# Patient Record
Sex: Male | Born: 1975
Health system: Southern US, Community
[De-identification: ages and names within clinical notes are randomized; demographics above are authoritative.]

## PROBLEM LIST (undated history)

## (undated) ENCOUNTER — Ambulatory Visit (HOSPITAL_COMMUNITY): Admission: EM | Payer: Medicaid Other | Source: Home / Self Care

## (undated) DIAGNOSIS — G8929 Other chronic pain: Secondary | ICD-10-CM

## (undated) DIAGNOSIS — M549 Dorsalgia, unspecified: Secondary | ICD-10-CM

## (undated) DIAGNOSIS — E119 Type 2 diabetes mellitus without complications: Secondary | ICD-10-CM

---

## 2012-12-31 ENCOUNTER — Encounter (HOSPITAL_COMMUNITY): Payer: Self-pay | Admitting: Emergency Medicine

## 2012-12-31 ENCOUNTER — Emergency Department (HOSPITAL_COMMUNITY): Payer: Self-pay

## 2012-12-31 ENCOUNTER — Emergency Department (HOSPITAL_COMMUNITY)
Admission: EM | Admit: 2012-12-31 | Discharge: 2012-12-31 | Disposition: A | Discharge status: Home / Self Care | Payer: Self-pay | Attending: Emergency Medicine | Admitting: Emergency Medicine

## 2012-12-31 DIAGNOSIS — M546 Pain in thoracic spine: Secondary | ICD-10-CM | POA: Insufficient documentation

## 2012-12-31 DIAGNOSIS — F17200 Nicotine dependence, unspecified, uncomplicated: Secondary | ICD-10-CM | POA: Insufficient documentation

## 2012-12-31 DIAGNOSIS — M62838 Other muscle spasm: Secondary | ICD-10-CM | POA: Insufficient documentation

## 2012-12-31 MED ORDER — HYDROCODONE-ACETAMINOPHEN 5-325 MG PO TABS: 1.0000 | ORAL_TABLET | ORAL | Status: DC | PRN

## 2012-12-31 MED ORDER — ONDANSETRON 4 MG PO TBDP
4.0000 mg | ORAL_TABLET | Freq: Once | ORAL | Status: AC
  Administered 2012-12-31: 4 mg via ORAL
  Filled 2012-12-31: qty 1

## 2012-12-31 MED ORDER — CYCLOBENZAPRINE HCL 10 MG PO TABS: 10.0000 mg | ORAL_TABLET | Freq: Two times a day (BID) | ORAL | Status: DC | PRN

## 2012-12-31 MED ORDER — DIAZEPAM 5 MG PO TABS
5.0000 mg | ORAL_TABLET | Freq: Once | ORAL | Status: AC
  Administered 2012-12-31: 5 mg via ORAL
  Filled 2012-12-31: qty 1

## 2012-12-31 MED ORDER — MORPHINE SULFATE 4 MG/ML IJ SOLN
4.0000 mg | Freq: Once | INTRAMUSCULAR | Status: AC
  Administered 2012-12-31: 4 mg via INTRAMUSCULAR
  Filled 2012-12-31: qty 1

## 2012-12-31 MED ORDER — HYDROMORPHONE HCL PF 2 MG/ML IJ SOLN
2.0000 mg | Freq: Once | INTRAMUSCULAR | Status: AC
Start: 2012-12-31 — End: 2012-12-31
  Administered 2012-12-31: 2 mg via INTRAMUSCULAR
  Filled 2012-12-31: qty 1

## 2012-12-31 NOTE — ED Notes (Signed)
Pt. Stated, I felt my neck start to hurt and then my back went out. I have a herniated disc L6 and L7

## 2012-12-31 NOTE — ED Notes (Signed)
Pt placed on continuous pulse ox - 99% on RA.

## 2012-12-31 NOTE — ED Provider Notes (Signed)
CSN: [id number]     Arrival date & time 12/31/12  1331 History    This chart was scribed for Dorthula Matas, non-physician practitioner working with Gilda Crease, * by Leone Payor, ED Scribe. This patient was seen in room TR09C/TR09C and the patient's care was started at 1331.   First MD Initiated Contact with Patient 12/31/12 1512     Chief Complaint  Patient presents with  . Back Pain  . Headache    The history is provided by the patient. No language interpreter was used.    HPI Comments: Mark Farmer is a 37 y.o. male who presents to the Emergency Department complaining of constant, unchanged neck and back pain starting earlier today. States he tried to crack his neck while walking and he fell to the ground from the severity of pain to the neck and mid back. Pain is worst to the neck and mid back area currently.  He also started to have a HA with pain behind the eyes when he fell down. The HA is still present but the pain behind the eyes has subsided.  Pt has a history of back problems to his lower back. He denies taking OTC medication to treat symptoms.   History reviewed. No pertinent past medical history. History reviewed. No pertinent past surgical history. No family history on file. History  Substance Use Topics  . Smoking status: Current Some Day Smoker  . Smokeless tobacco: Not on file  . Alcohol Use: Yes    Review of Systems  HENT: Positive for neck pain.   Musculoskeletal: Positive for back pain.  All other systems reviewed and are negative.    Allergies  Review of patient's allergies indicates no known allergies.  Home Medications  No current outpatient prescriptions on file. BP 158/95  Pulse 72  Temp(Src) 98.1 F (36.7 C) (Oral)  Resp 16  SpO2 100% Physical Exam  Nursing note and vitals reviewed. Constitutional: He is oriented to person, place, and time. He appears well-developed and well-nourished.  HENT:  Head: Normocephalic and  atraumatic.  Eyes: Conjunctivae and EOM are normal. Pupils are equal, round, and reactive to light.  Neck: Normal range of motion. Neck supple.  Cardiovascular: Normal rate, regular rhythm and normal heart sounds.   Pulmonary/Chest: Effort normal and breath sounds normal.  Abdominal: Soft. Bowel sounds are normal.  Musculoskeletal: Normal range of motion.       Cervical back: He exhibits pain and spasm.       Back:  Pt having severe muscle spasms   Neurological: He is alert and oriented to person, place, and time.  Skin: Skin is warm and dry.  Psychiatric: He has a normal mood and affect.    ED Course   Procedures (including critical care time)  DIAGNOSTIC STUDIES: Oxygen Saturation is 98% on RA, normal by my interpretation.    COORDINATION OF CARE: 4:05 PM Discussed treatment plan with pt at bedside and pt agreed to plan.   4:49 PM  Discussed discharge plan with pt. Will give stronger medication.   Labs Reviewed - No data to display Dg Cervical Spine Complete  12/31/2012   *RADIOLOGY REPORT*  Clinical Data: Neck pain and headache  CERVICAL SPINE - COMPLETE 4+ VIEW  Comparison: None.  Findings: Frontal, lateral, open mouth odontoid, and bilateral oblique views were obtained.  There is no fracture or spondylolisthesis.  Prevertebral soft tissues and predental space regions are normal.  There is moderate disc space narrowing at C6- 7.  There is slight disc space narrowing at C5-6.  Other disc spaces appear normal.  There is no appreciable exit foraminal narrowing on the oblique views.  IMPRESSION: Mild osteoarthritic change.  No fracture or spondylolisthesis.   Original Report Authenticated By: Bretta Bang, M.D.   1. Muscle spasms of neck   2. Muscle spasms of lower extremity     MDM  Pt given 4mg  IM Morphine and 5 mg tab of Valium. He went to xray, came back and approx an hour later the patient was continuing to have severe muscle spasm and was writhing in the exam bed. Said  his pain was worse then before getting the medicine after coming back from xray.  He was then given 2mg  IM Dilaudid. Pts pain went away but he did not like the feeling in his head of the medication. He ate a sandwich and drank a drink which made it a bit better. He wants to leave, family members are with him and are comfortable taking him home. Vitals signs have remained stable. Will dc  37 y.o.Mark Farmer's evaluation in the Emergency Department is complete. It has been determined that no acute conditions requiring further emergency intervention are present at this time. The patient/guardian have been advised of the diagnosis and plan. We have discussed signs and symptoms that warrant return to the ED, such as changes or worsening in symptoms. O2 sats 100 percent.  Vital signs are stable at discharge. Filed Vitals:   12/31/12 1730  BP: 158/95  Pulse: 72  Temp:   Resp:     Patient/guardian has voiced understanding and agreed to follow-up with the PCP or specialist.   Dorthula Matas, PA-C 12/31/12 1757

## 2013-01-01 NOTE — ED Provider Notes (Signed)
Medical screening examination/treatment/procedure(s) were performed by non-physician practitioner and as supervising physician I was immediately available for consultation/collaboration.    Jacole Capley J. Kamarion Zagami, MD 01/01/13 1544 

## 2013-08-09 ENCOUNTER — Emergency Department (HOSPITAL_COMMUNITY)
Admission: EM | Admit: 2013-08-09 | Discharge: 2013-08-09 | Disposition: A | Discharge status: Home / Self Care | Payer: Medicaid Other | Attending: Emergency Medicine | Admitting: Emergency Medicine

## 2013-08-09 ENCOUNTER — Encounter (HOSPITAL_COMMUNITY): Payer: Self-pay | Admitting: Emergency Medicine

## 2013-08-09 DIAGNOSIS — Y939 Activity, unspecified: Secondary | ICD-10-CM | POA: Insufficient documentation

## 2013-08-09 DIAGNOSIS — M62830 Muscle spasm of back: Secondary | ICD-10-CM

## 2013-08-09 DIAGNOSIS — M5380 Other specified dorsopathies, site unspecified: Secondary | ICD-10-CM | POA: Insufficient documentation

## 2013-08-09 DIAGNOSIS — Y9289 Other specified places as the place of occurrence of the external cause: Secondary | ICD-10-CM | POA: Insufficient documentation

## 2013-08-09 DIAGNOSIS — M62838 Other muscle spasm: Secondary | ICD-10-CM | POA: Insufficient documentation

## 2013-08-09 DIAGNOSIS — X500XXA Overexertion from strenuous movement or load, initial encounter: Secondary | ICD-10-CM | POA: Insufficient documentation

## 2013-08-09 MED ORDER — HYDROCODONE-ACETAMINOPHEN 5-325 MG PO TABS
1.0000 | ORAL_TABLET | Freq: Once | ORAL | Status: AC
  Administered 2013-08-09: 1 via ORAL
  Filled 2013-08-09 (×2): qty 1

## 2013-08-09 MED ORDER — IBUPROFEN 400 MG PO TABS
800.0000 mg | ORAL_TABLET | Freq: Once | ORAL | Status: AC
  Administered 2013-08-09: 800 mg via ORAL
  Filled 2013-08-09: qty 2

## 2013-08-09 MED ORDER — CYCLOBENZAPRINE HCL 10 MG PO TABS: 10.0000 mg | ORAL_TABLET | Freq: Two times a day (BID) | ORAL | Status: DC | PRN

## 2013-08-09 MED ORDER — HYDROCODONE-ACETAMINOPHEN 5-325 MG PO TABS: ORAL_TABLET | ORAL | Status: DC

## 2013-08-09 MED ORDER — DIAZEPAM 5 MG PO TABS
5.0000 mg | ORAL_TABLET | Freq: Once | ORAL | Status: AC
  Administered 2013-08-09: 5 mg via ORAL
  Filled 2013-08-09: qty 1

## 2013-08-09 NOTE — ED Notes (Signed)
Per PA, no other pain med orders obtained.

## 2013-08-09 NOTE — ED Notes (Addendum)
Pt states Hydrocodone does not cause itching, sometimes it causes diarrhea or vomiting.  Pt wants to take the Hydrocodone now and if causes any adverse symptoms will not get prescription filled. PA aware.

## 2013-08-09 NOTE — ED Notes (Signed)
The pt has had neck and back pain  Since he lifted a box high upward he felt a pop and the pain came and increased.  Hx of the same

## 2013-08-09 NOTE — ED Notes (Signed)
Pt refused Hydrocodone.

## 2013-08-09 NOTE — Discharge Instructions (Signed)
°Emergency Department Resource Guide °1) Find a Doctor and Pay Out of Pocket °Although you won't have to find out who is covered by your insurance plan, it is a good idea to ask around and get recommendations. You will then need to call the office and see if the doctor you have chosen will accept you as a new patient and what types of options they offer for patients who are self-pay. Some doctors offer discounts or will set up payment plans for their patients who do not have insurance, but you will need to ask so you aren't surprised when you get to your appointment. ° °2) Contact Your Local Health Department °Not all health departments have doctors that can see patients for sick visits, but many do, so it is worth a call to see if yours does. If you don't know where your local health department is, you can check in your phone book. The CDC also has a tool to help you locate your state's health department, and many state websites also have listings of all of their local health departments. ° °3) Find a Walk-in Clinic °If your illness is not likely to be very severe or complicated, you may want to try a walk in clinic. These are popping up all over the country in pharmacies, drugstores, and shopping centers. They're usually staffed by nurse practitioners or physician assistants that have been trained to treat common illnesses and complaints. They're usually fairly quick and inexpensive. However, if you have serious medical issues or chronic medical problems, these are probably not your best option. ° °No Primary Care Doctor: °- Call [facility] at  832-8000 - they can help you locate a primary care doctor that  accepts your insurance, provides certain services, etc. °- Physician Referral Service- 1-800-533-3463 ° °Chronic Pain Problems: °Organization         Address  Phone   Notes  °[facility]  ([phone]) [phone] Patients need to be referred by their primary care doctor.  ° °Medication  Assistance: °Organization         Address  Phone   Notes  °[place] [facility] [address] °[place], [place] [address] [phone]) [phone] --Must be a resident of [place] °-- Must have NO insurance coverage whatsoever (no Medicaid/ Medicare, etc.) °-- The pt. MUST have a primary care doctor that directs their care regularly and follows them in the community °  °[facility]  (866) 331-1348   °[employer]  (888) 892-1162   ° °Agencies that provide inexpensive medical care: °Organization         Address  Phone   Notes  °[facility]  ([phone]) [phone]   °[facility]    ([phone]) 832-7272   °[facility] [address] °[place], [place] [address] ([phone]) [phone]   °[facility] [place] [address], °[place] (336) [phone]   °[facility]    (336) 373-0678   °[facility]    (336) 272-1050   °[facility] ° [address], [place] Phone:  (336) [phone], Fax:  ([phone] Hours of Operation:  9 am - 6 pm, M-F.  Also accepts Medicaid/Medicare and self-pay.  °[facility] ° [address], [place] Phone: (336) [phone], Fax: ([phone]) [phone]. Hours of Operation:  8:30 am - 5:30 pm, M-F.  Also accepts Medicaid and self-pay.  °[facility] [place] [address], [place] Phone: (336) [phone]   °[facility] [address], [place], [place] (336)[phone], Ext. 123 Mondays & Thursdays: 7-9 AM.  First 15 patients are seen on a first come, first serve basis. °  ° °Medicaid-accepting [place] Providers: ° °Organization         Address  Phone   Notes  °[facility] [address], [place] ([phone]) [phone] Also accepts self-pay patients.  °[facility] [address], [place] ° (336) [phone]   °[facility] [address], [place]  ([phone]) [phone]   °[facility] [address] [place] [address], [place] ([phone]) [phone]   °[facility] [address], [place]  ° ([phone]) [phone] Only accepts [place] Access Medicaid patients after they have their name applied to their card.  ° °Self-Pay (no insurance) in [place]: ° °Organization         Address  Phone   Notes  °Sickle Cell Patients, [facility] [address], [place] ([phone]) [phone]   °[facility] [address], [place] ([phone]) [phone]   °[facility] ° [address], [place] ([phone]) [phone]   °[facility]/Dr. Osei-Bonsu ° [address] [place] [address], [place] or [address], [place] [phone]) [phone] Phone number for both [place] and [place] locations is the same.  °[facility] [address], [place] ([phone]) [phone]   °[facility] [address] [place] [address], [place] or [address] ([phone]) [phone] °(336) [phone]   °[facility] [address], [place] (336) [phone], phone; ([phone]) [phone], fax Sees patients 1st and 3rd Saturday of every month.  Must not qualify for public or private insurance (i.e. Medicaid, Medicare, [place] Health Choice, Veterans' Benefits) • Household income should be no more than 200% of the poverty level •The clinic cannot treat you if you are pregnant or think you are pregnant • Sexually transmitted diseases are not treated at the clinic.  ° °

## 2013-08-09 NOTE — ED Notes (Signed)
Pt testing on cell during triage

## 2013-08-09 NOTE — ED Provider Notes (Signed)
CSN: [id number]     Arrival date & time 08/09/13  1846 History  This chart was scribed for non-physician practitioner, [name], PA-C working with Toy BakerAnthony T Allen, MD by Greggory StallionKayla Andersen, ED scribe. This patient was seen in room TR10C/TR10C and the patient's care was started at 7:58 PM.   Chief Complaint  Patient presents with  . Back Pain   The history is provided by the patient. No language interpreter was used.   HPI Comments: Mark Farmer is a 38 y.o. male who presents to the Emergency Department complaining of sudden onset, worsening, upper back pain and neck pain that started about one hour ago, 9/10 pain. He states he was trying to get a box off the top shelf at work and felt a pop in his back. Certain movements worsen the pain and shoot it into his arms. Pt has had the same symptoms in the past. He has taken a Goody powder with no relief. Pt is requesting orthopedic referrals.   History reviewed. No pertinent past medical history. History reviewed. No pertinent past surgical history. No family history on file. History  Substance Use Topics  . Smoking status: Current Some Day Smoker  . Smokeless tobacco: Not on file  . Alcohol Use: Yes    Review of Systems  Musculoskeletal: Positive for back pain and neck pain.  All other systems reviewed and are negative.   Allergies  Hydrocodone  Home Medications   Current Outpatient Rx  Name  Route  Sig  Dispense  Refill  . cyclobenzaprine (FLEXERIL) 10 MG tablet   Oral   Take 1 tablet (10 mg total) by mouth 2 (two) times daily as needed for muscle spasms.   20 tablet   0   . HYDROcodone-acetaminophen (NORCO/VICODIN) 5-325 MG per tablet      Take 1-2 tabs every 4-6 hours as needed for pain.   6 tablet   0    BP 139/86  Pulse 80  Temp(Src) 98.3 F (36.8 C) (Oral)  Resp 16  Ht 5\' 9"  (1.753 m)  Wt 234 lb (106.142 kg)  BMI 34.54 kg/m2  SpO2 97%  Physical Exam  Nursing note and vitals reviewed. Constitutional: He is  oriented to person, place, and time. He appears well-developed and well-nourished.  HENT:  Head: Normocephalic and atraumatic.  Eyes: EOM are normal.  Neck: Normal range of motion. Neck supple.  Increased pain with rotation of head to the right and full neck extension. No pain with neck flexion or rotation to left.   Cardiovascular: Normal rate.   Pulmonary/Chest: Effort normal.  Musculoskeletal: Normal range of motion.  Tenderness along right upper trapezius and right and left thoracic paraspinal muscles. No midline spinal tenderness. Antalgic gait. 5/5 grip strength. Increased pain with full right arm abduction.   Neurological: He is alert and oriented to person, place, and time.  Skin: Skin is warm and dry.  Psychiatric: He has a normal mood and affect. His behavior is normal.    ED Course  Procedures (including critical care time)  DIAGNOSTIC STUDIES: Oxygen Saturation is 97% on RA, normal by my interpretation.    COORDINATION OF CARE: 8:01 PM-Discussed treatment plan which includes a muscle relaxer, pain medication and an anti-inflammatory with pt at bedside and pt agreed to plan. Advised pt to follow up with an orthopedist.   Labs Review Labs Reviewed - No data to display Imaging Review No results found.   EKG Interpretation None      MDM  Final diagnoses:  Neck muscle spasm  Spasm of back muscles    Pt with hx of back spasms c/o exacerbation of back pain and back and neck spasm after lifting a box at work earlier today. Denies taking pain meds PTA.  Did not fall during incident. Pt is tender in musculature, no bony tenderness. No numbness or tingling in arms or legs.  Do not believe imaging needed at this time. Not concerned for emergent process taking place. Will tx symptomatically as needed for pain.  Tx in ED: valium, norco, and ibuprofen.  Rx: flexeril and norco (pt had same in past for similar incident according to medical records).  Advised to f/u with PCP,  resource guide provided.  Pt also given contact info for Dr. Victorino Dike, orthopedics for continued back pain. Return precautions provided. Pt verbalized understanding and agreement with tx plan.    I personally performed the services described in this documentation, which was scribed in my presence. The recorded information has been reviewed and is accurate.   Junius Finner, PA-C 08/09/13 2114

## 2013-08-10 NOTE — ED Provider Notes (Signed)
Medical screening examination/treatment/procedure(s) were performed by non-physician practitioner and as supervising physician I was immediately available for consultation/collaboration.  Harper Vandervoort T Punam Broussard, MD 08/10/13 1558 

## 2014-06-16 ENCOUNTER — Emergency Department (HOSPITAL_COMMUNITY): Payer: Medicaid Other

## 2014-06-16 ENCOUNTER — Emergency Department (HOSPITAL_COMMUNITY)
Admission: EM | Admit: 2014-06-16 | Discharge: 2014-06-16 | Disposition: A | Discharge status: Home / Self Care | Payer: Medicaid Other | Attending: Emergency Medicine | Admitting: Emergency Medicine

## 2014-06-16 ENCOUNTER — Encounter (HOSPITAL_COMMUNITY): Payer: Self-pay | Admitting: Adult Health

## 2014-06-16 DIAGNOSIS — Z720 Tobacco use: Secondary | ICD-10-CM | POA: Diagnosis not present

## 2014-06-16 DIAGNOSIS — Y9389 Activity, other specified: Secondary | ICD-10-CM | POA: Insufficient documentation

## 2014-06-16 DIAGNOSIS — S6990XA Unspecified injury of unspecified wrist, hand and finger(s), initial encounter: Secondary | ICD-10-CM | POA: Diagnosis present

## 2014-06-16 DIAGNOSIS — W230XXA Caught, crushed, jammed, or pinched between moving objects, initial encounter: Secondary | ICD-10-CM | POA: Diagnosis not present

## 2014-06-16 DIAGNOSIS — Y9289 Other specified places as the place of occurrence of the external cause: Secondary | ICD-10-CM | POA: Diagnosis not present

## 2014-06-16 DIAGNOSIS — T1490XA Injury, unspecified, initial encounter: Secondary | ICD-10-CM

## 2014-06-16 DIAGNOSIS — S60221A Contusion of right hand, initial encounter: Secondary | ICD-10-CM | POA: Diagnosis not present

## 2014-06-16 DIAGNOSIS — Y998 Other external cause status: Secondary | ICD-10-CM | POA: Insufficient documentation

## 2014-06-16 MED ORDER — OXYCODONE-ACETAMINOPHEN 5-325 MG PO TABS: 2.0000 | ORAL_TABLET | ORAL | Status: DC | PRN

## 2014-06-16 MED ORDER — OXYCODONE-ACETAMINOPHEN 5-325 MG PO TABS
1.0000 | ORAL_TABLET | Freq: Once | ORAL | Status: AC
  Administered 2014-06-16: 1 via ORAL
  Filled 2014-06-16: qty 1

## 2014-06-16 NOTE — ED Notes (Signed)
Presents with right hand injury from slamming hand in door on Friday night. CMS intact. Right hand with swelling.

## 2014-06-16 NOTE — Discharge Instructions (Signed)
Contusion °A contusion is a deep bruise. Contusions are the result of an injury that caused bleeding under the skin. The contusion may turn blue, purple, or yellow. Minor injuries will give you a painless contusion, but more severe contusions may stay painful and swollen for a few weeks.  °CAUSES  °A contusion is usually caused by a blow, trauma, or direct force to an area of the body. °SYMPTOMS  °· Swelling and redness of the injured area. °· Bruising of the injured area. °· Tenderness and soreness of the injured area. °· Pain. °DIAGNOSIS  °The diagnosis can be made by taking a history and physical exam. An X-ray, CT scan, or MRI may be needed to determine if there were any associated injuries, such as fractures. °TREATMENT  °Specific treatment will depend on what area of the body was injured. In general, the best treatment for a contusion is resting, icing, elevating, and applying cold compresses to the injured area. Over-the-counter medicines may also be recommended for pain control. Ask your caregiver what the best treatment is for your contusion. °HOME CARE INSTRUCTIONS  °· Put ice on the injured area. °¨ Put ice in a plastic bag. °¨ Place a towel between your skin and the bag. °¨ Leave the ice on for 15-20 minutes, 3-4 times a day, or as directed by your health care provider. °· Only take over-the-counter or prescription medicines for pain, discomfort, or fever as directed by your caregiver. Your caregiver may recommend avoiding anti-inflammatory medicines (aspirin, ibuprofen, and naproxen) for 48 hours because these medicines may increase bruising. °· Rest the injured area. °· If possible, elevate the injured area to reduce swelling. °SEEK IMMEDIATE MEDICAL CARE IF:  °· You have increased bruising or swelling. °· You have pain that is getting worse. °· Your swelling or pain is not relieved with medicines. °MAKE SURE YOU:  °· Understand these instructions. °· Will watch your condition. °· Will get help right  away if you are not doing well or get worse. °Document Released: 03/01/2005 Document Revised: 05/27/2013 Document Reviewed: 03/27/2011 °ExitCare® Patient Information ©2015 [employer]. This information is not intended to replace advice given to you by your health care provider. Make sure you discuss any questions you have with your health care provider. ° °

## 2014-06-16 NOTE — ED Provider Notes (Signed)
CSN: [id number]     Arrival date & time 06/16/14  1610 History   None    Chief Complaint  Patient presents with  . Hand Injury     (Consider location/radiation/quality/duration/timing/severity/associated sxs/prior Treatment) Patient is a 39 y.o. male presenting with hand pain. The history is provided by the patient. No language interpreter was used.  Hand Pain This is a new problem. The current episode started today. The problem occurs constantly. The problem has been gradually worsening. Pertinent negatives include no nausea. He has tried nothing for the symptoms. The treatment provided moderate relief.  Pt closed hand in a drawer  History reviewed. No pertinent past medical history. History reviewed. No pertinent past surgical history. History reviewed. No pertinent family history. History  Substance Use Topics  . Smoking status: Current Some Day Smoker  . Smokeless tobacco: Not on file  . Alcohol Use: Yes    Review of Systems  Gastrointestinal: Negative for nausea.  All other systems reviewed and are negative.     Allergies  Hydrocodone  Home Medications   Prior to Admission medications   Medication Sig Start Date End Date Taking? Authorizing Provider  cyclobenzaprine (FLEXERIL) 10 MG tablet Take 1 tablet (10 mg total) by mouth 2 (two) times daily as needed for muscle spasms. 08/09/13   [name], PA-C  HYDROcodone-acetaminophen (NORCO/VICODIN) 5-325 MG per tablet Take 1-2 tabs every 4-6 hours as needed for pain. 08/09/13   [name], PA-C   BP 154/90 mmHg  Pulse 102  Temp(Src) 98.9 F (37.2 C)  Resp 18  Ht 5\' 10"  (1.778 m)  Wt 230 lb (104.327 kg)  BMI 33.00 kg/m2  SpO2 96% Physical Exam  Constitutional: He is oriented to person, place, and time. He appears well-developed and well-nourished.  HENT:  Head: Normocephalic.  Musculoskeletal: He exhibits tenderness.  Decreased range of motion, swelling,  nv and ns intact  Neurological: He is alert and  oriented to person, place, and time. He has normal reflexes.  Skin: There is erythema.  Psychiatric: He has a normal mood and affect.  Nursing note and vitals reviewed.   ED Course  Procedures (including critical care time) Labs Review Labs Reviewed - No data to display  Imaging Review Dg Hand Complete Right  06/16/2014   CLINICAL DATA:  Second metacarpal swelling, slammed hand in the door on Friday  EXAM: RIGHT HAND - COMPLETE 3+ VIEW  COMPARISON:  None.  FINDINGS: Three views of the right hand submitted. No acute fracture or subluxation. There is old fracture deformity midshaft of second metacarpal. Mild spurring of distal aspect of second metacarpal.  IMPRESSION: No acute fracture or subluxation. Old appearing fracture deformity midshaft of second metacarpal.   Electronically Signed   By: Natasha MeadLiviu  Pop M.D.   On: 06/16/2014 16:53     EKG Interpretation None      MDM   Final diagnoses:  Contusion of right hand, initial encounter    Ace wrap Percocet See Dr. Amanda PeaGramig  For recheck if pain persist past one week    Mark AreasLeslie K Sofia, PA-C 06/16/14 1746  Tilden FossaElizabeth Rees, MD 06/16/14 [phone]

## 2016-06-10 ENCOUNTER — Emergency Department (HOSPITAL_COMMUNITY)
Admission: EM | Admit: 2016-06-10 | Discharge: 2016-06-10 | Disposition: A | Discharge status: Home / Self Care | Payer: Self-pay | Attending: Emergency Medicine | Admitting: Emergency Medicine

## 2016-06-10 ENCOUNTER — Emergency Department (HOSPITAL_COMMUNITY): Payer: Self-pay

## 2016-06-10 ENCOUNTER — Encounter (HOSPITAL_COMMUNITY): Payer: Self-pay

## 2016-06-10 DIAGNOSIS — Z111 Encounter for screening for respiratory tuberculosis: Secondary | ICD-10-CM | POA: Insufficient documentation

## 2016-06-10 DIAGNOSIS — F17200 Nicotine dependence, unspecified, uncomplicated: Secondary | ICD-10-CM | POA: Insufficient documentation

## 2016-06-10 DIAGNOSIS — Z79899 Other long term (current) drug therapy: Secondary | ICD-10-CM | POA: Insufficient documentation

## 2016-06-10 HISTORY — DX: Other chronic pain: G89.29

## 2016-06-10 HISTORY — DX: Dorsalgia, unspecified: M54.9

## 2016-06-10 NOTE — ED Triage Notes (Signed)
Pt presents for chest xray to r/o TB for his job.  Pt reports he is allergic to the vaccine.

## 2016-06-10 NOTE — ED Notes (Signed)
Declined W/C at D/C and was escorted to lobby by RN. 

## 2016-06-10 NOTE — Discharge Instructions (Signed)
There were no abnormalities noted on the x-ray. This type of testing is not appropriate for the emergency room. In the future please obtain routine testing of this sort through a primary care provider or other outpatient resources.

## 2016-06-10 NOTE — ED Provider Notes (Signed)
[facility]-EMERGENCY DEPT Provider Note   CSN: [id number] Arrival date & time: 06/10/16  1037  By signing my name below, I, Sonum Patel, attest that this documentation has been prepared under the direction and in the presence of Saachi Zale, PA-C. Electronically Signed: Sonum Patel, Neurosurgeon. 06/10/16. 12:08 PM.  History   Chief Complaint No chief complaint on file.   The history is provided by the patient. No language interpreter was used.     HPI Comments: Mark Farmer is a 41 y.o. male who presents to the Emergency Department today requesting a CXR to rule out TB. He states he is allergic to the PPD serum and his job requires he have a CXR for work. He also states he tried to go to his PCP and they told him to come here for the xray. He denies cough, fever, SOB, or any associated physical symptoms or complaints at this time.  Denies foreign birth, TB vaccine, or known exposure to TB.    Past Medical History:  Diagnosis Date  . Chronic back pain     There are no active problems to display for this patient.   History reviewed. No pertinent surgical history.     Home Medications    Prior to Admission medications   Medication Sig Start Date End Date Taking? Authorizing Provider  cyclobenzaprine (FLEXERIL) 10 MG tablet Take 1 tablet (10 mg total) by mouth 2 (two) times daily as needed for muscle spasms. 08/09/13   Junius Finner, PA-C  HYDROcodone-acetaminophen (NORCO/VICODIN) 5-325 MG per tablet Take 1-2 tabs every 4-6 hours as needed for pain. 08/09/13   Junius Finner, PA-C  oxyCODONE-acetaminophen (PERCOCET/ROXICET) 5-325 MG per tablet Take 2 tablets by mouth every 4 (four) hours as needed for severe pain. 06/16/14   Elson Areas, PA-C    Family History History reviewed. No pertinent family history.  Social History Social History  Substance Use Topics  . Smoking status: Current Some Day Smoker  . Smokeless tobacco: Never Used  . Alcohol use Yes     Allergies    Hydrocodone   Review of Systems Review of Systems  Constitutional: Negative for fever.  Respiratory: Negative for cough and shortness of breath.   Cardiovascular: Negative for chest pain.  Gastrointestinal: Negative for abdominal pain.     Physical Exam Updated Vital Signs BP 147/91 (BP Location: Right Arm)   Pulse 87   Temp 97.5 F (36.4 C) (Oral)   Resp 18   Ht 5\' 9"  (1.753 m)   Wt 99.8 kg   SpO2 100%   BMI 32.49 kg/m   Physical Exam  Constitutional: He appears well-developed and well-nourished. No distress.  HENT:  Head: Normocephalic and atraumatic.  Eyes: Conjunctivae are normal.  Neck: Neck supple.  Cardiovascular: Normal rate and regular rhythm.   Pulmonary/Chest: Effort normal and breath sounds normal. No respiratory distress.  Neurological: He is alert.  Skin: Skin is warm and dry. He is not diaphoretic.  Psychiatric: He has a normal mood and affect. His behavior is normal.  Nursing note and vitals reviewed.    ED Treatments / Results  DIAGNOSTIC STUDIES: Oxygen Saturation is 100% on RA, normal by my interpretation.    COORDINATION OF CARE: 12:11 PM Discussed treatment plan with pt at bedside and pt agreed to plan.    Labs (all labs ordered are listed, but only abnormal results are displayed) Labs Reviewed - No data to display  EKG  EKG Interpretation None       Radiology  Dg Chest 2 View  Result Date: 06/10/2016 CLINICAL DATA:  R/O TB for pt's new job. No current chest complaints. Hx of diabetes and COPD. Smoker 1 ppd. EXAM: CHEST  2 VIEW COMPARISON:  None. FINDINGS: The heart size and mediastinal contours are within normal limits. Both lungs are clear. No pleural effusion or pneumothorax. The visualized skeletal structures are unremarkable. IMPRESSION: No active cardiopulmonary disease. Electronically Signed   By: Amie Portlandavid  Ormond M.D.   On: 06/10/2016 11:55    Procedures Procedures (including critical care time)  Medications Ordered in  ED Medications - No data to display   Initial Impression / Assessment and Plan / ED Course  I have reviewed the triage vital signs and the nursing notes.  Pertinent labs & imaging results that were available during my care of the patient were reviewed by me and considered in my medical decision making (see chart for details).  Clinical Course     Patient presents for screening chest x-ray to rule out TB as a condition for employment. Denies symptoms or risk factors. Patient was informed that the emergency department is not the appropriate place for this sort of screening exam. Patient was advised to have his PCP schedule him for an outpatient study ordered to go to an urgent care for future screenings.    Final Clinical Impressions(s) / ED Diagnoses   Final diagnoses:  Screening-pulmonary TB    New Prescriptions New Prescriptions   No medications on file   I personally performed the services described in this documentation, which was scribed in my presence. The recorded information has been reviewed and is accurate.   Anselm PancoastShawn C Kittie Krizan, PA-C 06/10/16 1219    Nira ConnPedro Eduardo Cardama, MD 06/10/16 [phone]

## 2016-09-06 ENCOUNTER — Ambulatory Visit (HOSPITAL_COMMUNITY)
Admission: EM | Admit: 2016-09-06 | Discharge: 2016-09-06 | Disposition: A | Discharge status: Home / Self Care | Payer: Medicaid Other | Attending: Family Medicine | Admitting: Family Medicine

## 2016-09-06 ENCOUNTER — Encounter (HOSPITAL_COMMUNITY): Payer: Self-pay | Admitting: Emergency Medicine

## 2016-09-06 DIAGNOSIS — K648 Other hemorrhoids: Secondary | ICD-10-CM

## 2016-09-06 DIAGNOSIS — M5432 Sciatica, left side: Secondary | ICD-10-CM

## 2016-09-06 DIAGNOSIS — R195 Other fecal abnormalities: Secondary | ICD-10-CM

## 2016-09-06 MED ORDER — NAPROXEN 500 MG PO TABS: 500.0000 mg | ORAL_TABLET | Freq: Two times a day (BID) | ORAL | 0 refills | Status: DC

## 2016-09-06 MED ORDER — KETOROLAC TROMETHAMINE 60 MG/2ML IM SOLN
INTRAMUSCULAR | Status: AC
  Filled 2016-09-06: qty 2

## 2016-09-06 MED ORDER — CYCLOBENZAPRINE HCL 10 MG PO TABS: 10.0000 mg | ORAL_TABLET | Freq: Two times a day (BID) | ORAL | 0 refills | Status: DC | PRN

## 2016-09-06 MED ORDER — KETOROLAC TROMETHAMINE 60 MG/2ML IM SOLN
60.0000 mg | Freq: Once | INTRAMUSCULAR | Status: AC
  Administered 2016-09-06: 60 mg via INTRAMUSCULAR

## 2016-09-06 MED ORDER — HYDROCORTISONE ACETATE 25 MG RE SUPP: 25.0000 mg | Freq: Two times a day (BID) | RECTAL | 0 refills | Status: DC

## 2016-09-06 NOTE — ED Notes (Signed)
The patient had a bowel movement after triage and stated that his back pain went from a 10+ to a 8. A small amount of blood was noted on the toilet paper.

## 2016-09-06 NOTE — ED Notes (Signed)
Urine sample collected

## 2016-09-06 NOTE — ED Triage Notes (Signed)
The patient presented to the [facility] with a complaint of lower left back pain that radiates to his front lower left and he also reported blood in his stool x 3 days.

## 2016-09-06 NOTE — ED Provider Notes (Signed)
CSN: [id number]     Arrival date & time 09/06/16  1018 History   First MD Initiated Contact with Patient 09/06/16 1110     Chief Complaint  Patient presents with  . Back Pain   (Consider location/radiation/quality/duration/timing/severity/associated sxs/prior Treatment) Patient c/o blood in stools and c/o back pain radiating down left leg to left knee.   The history is provided by the patient.  Back Pain  Location:  Lumbar spine Quality:  Aching Radiates to:  L posterior upper leg Pain severity:  Moderate Pain is:  Worse during the day Onset quality:  Sudden Duration:  2 days Timing:  Constant Progression:  Worsening Chronicity:  New Relieved by:  Nothing Worsened by:  Nothing Ineffective treatments:  None tried   Past Medical History:  Diagnosis Date  . Chronic back pain    History reviewed. No pertinent surgical history. History reviewed. No pertinent family history. Social History  Substance Use Topics  . Smoking status: Current Some Day Smoker  . Smokeless tobacco: Never Used  . Alcohol use Yes    Review of Systems  Constitutional: Negative.   HENT: Negative.   Eyes: Negative.   Respiratory: Negative.   Cardiovascular: Negative.   Gastrointestinal: Negative.   Endocrine: Negative.   Genitourinary: Negative.   Musculoskeletal: Positive for back pain.  Allergic/Immunologic: Negative.   Neurological: Negative.   Hematological: Negative.   Psychiatric/Behavioral: Negative.     Allergies  Hydrocodone  Home Medications   Prior to Admission medications   Medication Sig Start Date End Date Taking? Authorizing Provider  oxyCODONE-acetaminophen (PERCOCET/ROXICET) 5-325 MG per tablet Take 2 tablets by mouth every 4 (four) hours as needed for severe pain. 06/16/14  Yes Lonia Skinner Sofia, PA-C  cyclobenzaprine (FLEXERIL) 10 MG tablet Take 1 tablet (10 mg total) by mouth 2 (two) times daily as needed for muscle spasms. 09/06/16   Deatra Canter, FNP  hydrocortisone  (ANUSOL-HC) 25 MG suppository Place 1 suppository (25 mg total) rectally 2 (two) times daily. 09/06/16   Deatra Canter, FNP  naproxen (NAPROSYN) 500 MG tablet Take 1 tablet (500 mg total) by mouth 2 (two) times daily with a meal. 09/06/16   Deatra Canter, FNP   Meds Ordered and Administered this Visit   Medications  ketorolac (TORADOL) injection 60 mg (60 mg Intramuscular Given 09/06/16 1128)    BP (!) 142/100 (BP Location: Left Arm)   Pulse 78   Temp 98.3 F (36.8 C) (Oral)   Resp 18   SpO2 98%  No data found.   Physical Exam  Constitutional: He appears well-developed and well-nourished.  HENT:  Head: Normocephalic and atraumatic.  Eyes: Conjunctivae and EOM are normal. Pupils are equal, round, and reactive to light.  Neck: Normal range of motion. Neck supple.  Cardiovascular: Normal rate, regular rhythm and normal heart sounds.   Pulmonary/Chest: Effort normal and breath sounds normal.  Musculoskeletal: Normal range of motion. He exhibits tenderness.  TTP left lumbar spine  Nursing note and vitals reviewed.   Urgent Care Course     Procedures (including critical care time)  Labs Review Labs Reviewed - No data to display  Imaging Review No results found.   Visual Acuity Review  Right Eye Distance:   Left Eye Distance:   Bilateral Distance:    Right Eye Near:   Left Eye Near:    Bilateral Near:         MDM   1. Sciatica of left side   2. Internal hemorrhoids  Toradol  IM Naprosyn 500 mg one po bid x 10 days #20 Cyclobenzaprine 10 mg one po tid prn #30  Anusol HC  one pr bid #14      Deatra Canter, FNP 09/06/16 1204

## 2016-09-06 NOTE — ED Notes (Signed)
Urine specimen obtained and in lab 

## 2016-09-06 NOTE — Discharge Instructions (Signed)
Establish with [facility] and follow up for persistent back pain and sciatica

## 2016-11-16 ENCOUNTER — Encounter (HOSPITAL_COMMUNITY): Payer: Self-pay

## 2016-11-16 ENCOUNTER — Emergency Department (HOSPITAL_COMMUNITY)
Admission: EM | Admit: 2016-11-16 | Discharge: 2016-11-16 | Disposition: A | Discharge status: Home / Self Care | Payer: Self-pay | Attending: Emergency Medicine | Admitting: Emergency Medicine

## 2016-11-16 ENCOUNTER — Emergency Department (HOSPITAL_COMMUNITY): Payer: Self-pay

## 2016-11-16 DIAGNOSIS — Y929 Unspecified place or not applicable: Secondary | ICD-10-CM | POA: Insufficient documentation

## 2016-11-16 DIAGNOSIS — Z23 Encounter for immunization: Secondary | ICD-10-CM | POA: Insufficient documentation

## 2016-11-16 DIAGNOSIS — F17200 Nicotine dependence, unspecified, uncomplicated: Secondary | ICD-10-CM | POA: Insufficient documentation

## 2016-11-16 DIAGNOSIS — Y9389 Activity, other specified: Secondary | ICD-10-CM | POA: Insufficient documentation

## 2016-11-16 DIAGNOSIS — Y999 Unspecified external cause status: Secondary | ICD-10-CM | POA: Insufficient documentation

## 2016-11-16 DIAGNOSIS — S61210A Laceration without foreign body of right index finger without damage to nail, initial encounter: Secondary | ICD-10-CM | POA: Insufficient documentation

## 2016-11-16 DIAGNOSIS — W268XXA Contact with other sharp object(s), not elsewhere classified, initial encounter: Secondary | ICD-10-CM | POA: Insufficient documentation

## 2016-11-16 MED ORDER — TETANUS-DIPHTH-ACELL PERTUSSIS 5-2.5-18.5 LF-MCG/0.5 IM SUSP
0.5000 mL | Freq: Once | INTRAMUSCULAR | Status: AC
  Administered 2016-11-16: 0.5 mL via INTRAMUSCULAR
  Filled 2016-11-16: qty 0.5

## 2016-11-16 MED ORDER — CEPHALEXIN 500 MG PO CAPS: 500.0000 mg | ORAL_CAPSULE | Freq: Four times a day (QID) | ORAL | 0 refills | Status: DC

## 2016-11-16 MED ORDER — LIDOCAINE HCL (PF) 1 % IJ SOLN
5.0000 mL | Freq: Once | INTRAMUSCULAR | Status: AC
  Administered 2016-11-16: 5 mL
  Filled 2016-11-16: qty 5

## 2016-11-16 NOTE — ED Provider Notes (Signed)
[facility]PT Provider Note   CSN: [id number] Arrival date & time: 11/16/16  1455   By signing my name below, I, Clarisse Gouge, attest that this documentation has been prepared under the direction and in the presence of [facility], FNP. Electronically Signed: Clarisse Gouge, Scribe. 11/16/16. 3:52 PM.   History   Chief Complaint No chief complaint on file.  HPI  Mark Farmer is a 41 y.o. male presenting to the Emergency Department with chief complaint of R index laceration that he sustained ~1 hour prior to evaluation. He states he cut the finger on metal while working on his car. He describes 8/10, constant, aching pain. Pt adds the car is old and the metal was likely dirty. No suspicion for foreign bodies noted. Last tetanus unknown. No weakness or numbness noted.  Past Medical History:  Diagnosis Date  . Chronic back pain     There are no active problems to display for this patient.   History reviewed. No pertinent surgical history.     Home Medications    Prior to Admission medications   Medication Sig Start Date End Date Taking? Authorizing Provider  cephALEXin (KEFLEX) 500 MG capsule Take 1 capsule (500 mg total) by mouth 4 (four) times daily. 11/16/16   Janne Napoleon, NP  cyclobenzaprine (FLEXERIL) 10 MG tablet Take 1 tablet (10 mg total) by mouth 2 (two) times daily as needed for muscle spasms. 09/06/16   Deatra Canter, FNP  hydrocortisone (ANUSOL-HC) 25 MG suppository Place 1 suppository (25 mg total) rectally 2 (two) times daily. 09/06/16   Deatra Canter, FNP  naproxen (NAPROSYN) 500 MG tablet Take 1 tablet (500 mg total) by mouth 2 (two) times daily with a meal. 09/06/16   Deatra Canter, FNP  oxyCODONE-acetaminophen (PERCOCET/ROXICET) 5-325 MG per tablet Take 2 tablets by mouth every 4 (four) hours as needed for severe pain. 06/16/14   Elson Areas, PA-C    Family History No family history on file.  Social History Social History  Substance Use  Topics  . Smoking status: Current Some Day Smoker  . Smokeless tobacco: Never Used  . Alcohol use Yes     Allergies   Hydrocodone   Review of Systems Review of Systems  Constitutional: Negative for activity change.  HENT: Negative.   Musculoskeletal: Positive for arthralgias.  Skin: Positive for wound.  Neurological: Negative for weakness and numbness.     Physical Exam Updated Vital Signs BP (!) 154/100   Pulse 92   Temp 98.3 F (36.8 C) (Oral)   Resp 18   SpO2 98%   Physical Exam  Constitutional: He is oriented to person, place, and time. He appears well-developed and well-nourished. No distress.  HENT:  Head: Normocephalic.  Eyes: EOM are normal.  Neck: Normal range of motion.  Cardiovascular: Normal rate.   Pulmonary/Chest: Effort normal.  Musculoskeletal: Normal range of motion.  1 cm laceration to dorsum of R index finger at the PIP joint. Good strength. Radial pulses 2+. Sensation intact.  Neurological: He is alert and oriented to person, place, and time.  Skin: Laceration noted.  Psychiatric: He has a normal mood and affect.  Nursing note and vitals reviewed.    ED Treatments / Results  DIAGNOSTIC STUDIES: Oxygen Saturation is 98% on RA, NL by my interpretation.    COORDINATION OF CARE: 3:49 PM-Discussed next steps with pt. Pt verbalized understanding and is agreeable with the plan. Will order imaging. Pt prepared for laceration repair.  Labs (all labs ordered are listed, but only abnormal results are displayed) Labs Reviewed - No data to display   Radiology Dg Finger Index Right  Result Date: 11/16/2016 CLINICAL DATA:  Posterior laceration.  Pain. EXAM: RIGHT INDEX FINGER 2+V COMPARISON:  None. FINDINGS: There is soft tissue swelling at the PIP joint dorsally. No fracture or foreign body. IMPRESSION: Soft tissue swelling, otherwise negative. Electronically Signed   By: Elsie Stain M.D.   On: 11/16/2016 16:20    Procedures .Marland KitchenLaceration  Repair Date/Time: 11/16/2016 4:50 PM Performed by: Janne Napoleon Authorized by: Janne Napoleon   Consent:    Consent obtained:  Verbal   Consent given by:  Patient   Risks discussed:  Infection and pain   Alternatives discussed:  No treatment Anesthesia (see MAR for exact dosages):    Anesthesia method:  Local infiltration   Local anesthetic:  Lidocaine 1% w/o epi Laceration details:    Location:  Finger   Finger location:  R index finger   Length (cm):  1 Repair type:    Repair type:  Simple Pre-procedure details:    Preparation:  Patient was prepped and draped in usual sterile fashion and imaging obtained to evaluate for foreign bodies Exploration:    Wound exploration: wound explored through full range of motion and entire depth of wound probed and visualized     Wound extent: no foreign bodies/material noted, no muscle damage noted, no tendon damage noted and no underlying fracture noted     Contaminated: no   Treatment:    Amount of cleaning:  Standard   Irrigation solution:  Sterile saline   Irrigation method:  Syringe Skin repair:    Repair method:  Sutures   Suture size:  5-0   Suture material:  Prolene   Suture technique:  Simple interrupted   Number of sutures:  2 Approximation:    Approximation:  Close   Vermilion border: well-aligned   Post-procedure details:    Dressing:  Non-adherent dressing and splint for protection   Patient tolerance of procedure:  Tolerated well, no immediate complications    (including critical care time)  Medications Ordered in ED Medications  Tdap (BOOSTRIX) injection 0.5 mL (0.5 mLs Intramuscular Given 11/16/16 1625)  lidocaine (PF) (XYLOCAINE) 1 % injection 5 mL (5 mLs Infiltration Given 11/16/16 1626)     Initial Impression / Assessment and Plan / ED Course  I have reviewed the triage vital signs and the nursing notes.  Pertinent imaging results that were available during my care of the patient were reviewed by me and  considered in my medical decision making (see chart for details).  Tetanus updated in ED. Laceration occurred < 12 hours prior to repair. Discussed laceration care with pt and answered questions. Pt to f-u for suture removal in 7 to 10 days and wound check sooner should there be signs of dehiscence or infection. Pt is hemodynamically stable with no complaints prior to dc.  Will give Rx for Keflex due to dirty wound    Final Clinical Impressions(s) / ED Diagnoses   Final diagnoses:  Laceration of right index finger without foreign body without damage to nail, initial encounter    New Prescriptions Discharge Medication List as of 11/16/2016  4:56 PM    START taking these medications   Details  cephALEXin (KEFLEX) 500 MG capsule Take 1 capsule (500 mg total) by mouth 4 (four) times daily., Starting Thu 11/16/2016, Print      I personally performed  the services described in this documentation, which was scribed in my presence. The recorded information has been reviewed and is accurate.    Kerrie Buffaloeese, Hope Mari[place]M, [place]NP 11/17/16 1601    Rolland PorterJames, Mark, MD 11/20/16 [phone]

## 2016-11-16 NOTE — ED Triage Notes (Signed)
Patient here with small laceration to right hand index finger after cutting same on metal in car, no bleeding

## 2016-12-12 ENCOUNTER — Encounter (HOSPITAL_COMMUNITY): Payer: Self-pay | Admitting: Emergency Medicine

## 2016-12-12 DIAGNOSIS — S6391XA Sprain of unspecified part of right wrist and hand, initial encounter: Secondary | ICD-10-CM | POA: Diagnosis not present

## 2016-12-12 DIAGNOSIS — F17200 Nicotine dependence, unspecified, uncomplicated: Secondary | ICD-10-CM | POA: Insufficient documentation

## 2016-12-12 DIAGNOSIS — R51 Headache: Secondary | ICD-10-CM | POA: Insufficient documentation

## 2016-12-12 DIAGNOSIS — Y92410 Unspecified street and highway as the place of occurrence of the external cause: Secondary | ICD-10-CM | POA: Diagnosis not present

## 2016-12-12 DIAGNOSIS — T148XXA Other injury of unspecified body region, initial encounter: Secondary | ICD-10-CM | POA: Insufficient documentation

## 2016-12-12 DIAGNOSIS — M545 Low back pain: Secondary | ICD-10-CM | POA: Diagnosis not present

## 2016-12-12 DIAGNOSIS — Y939 Activity, unspecified: Secondary | ICD-10-CM | POA: Insufficient documentation

## 2016-12-12 DIAGNOSIS — Z79899 Other long term (current) drug therapy: Secondary | ICD-10-CM | POA: Diagnosis not present

## 2016-12-12 DIAGNOSIS — Y999 Unspecified external cause status: Secondary | ICD-10-CM | POA: Insufficient documentation

## 2016-12-12 DIAGNOSIS — S6981XA Other specified injuries of right wrist, hand and finger(s), initial encounter: Secondary | ICD-10-CM | POA: Diagnosis present

## 2016-12-12 MED ORDER — OXYCODONE-ACETAMINOPHEN 5-325 MG PO TABS
1.0000 | ORAL_TABLET | ORAL | Status: DC | PRN
  Administered 2016-12-12: 1 via ORAL

## 2016-12-12 MED ORDER — OXYCODONE-ACETAMINOPHEN 5-325 MG PO TABS
ORAL_TABLET | ORAL | Status: DC
Start: 2016-12-12 — End: 2016-12-13
  Filled 2016-12-12: qty 1
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
## 2016-12-13 ENCOUNTER — Emergency Department (HOSPITAL_COMMUNITY): Payer: No Typology Code available for payment source

## 2016-12-13 ENCOUNTER — Emergency Department (HOSPITAL_COMMUNITY)
Admission: EM | Admit: 2016-12-13 | Discharge: 2016-12-13 | Disposition: A | Discharge status: Home / Self Care | Payer: No Typology Code available for payment source | Attending: Emergency Medicine | Admitting: Emergency Medicine

## 2016-12-13 DIAGNOSIS — T07XXXA Unspecified multiple injuries, initial encounter: Secondary | ICD-10-CM

## 2016-12-13 DIAGNOSIS — S63501A Unspecified sprain of right wrist, initial encounter: Secondary | ICD-10-CM

## 2016-12-13 MED ORDER — IBUPROFEN 600 MG PO TABS: 600.0000 mg | ORAL_TABLET | Freq: Four times a day (QID) | ORAL | 0 refills | Status: DC | PRN

## 2016-12-13 MED ORDER — ONDANSETRON 4 MG PO TBDP
8.0000 mg | ORAL_TABLET | Freq: Once | ORAL | Status: AC
  Administered 2016-12-13: 8 mg via ORAL
  Filled 2016-12-13: qty 2

## 2016-12-13 MED ORDER — FAMOTIDINE 20 MG PO TABS
20.0000 mg | ORAL_TABLET | Freq: Once | ORAL | Status: AC
  Administered 2016-12-13: 20 mg via ORAL
  Filled 2016-12-13: qty 1

## 2016-12-13 MED ORDER — ONDANSETRON 4 MG PO TBDP: 4.0000 mg | ORAL_TABLET | Freq: Three times a day (TID) | ORAL | 0 refills | Status: DC | PRN

## 2016-12-13 MED ORDER — OXYCODONE-ACETAMINOPHEN 5-325 MG PO TABS
1.0000 | ORAL_TABLET | Freq: Once | ORAL | Status: AC
  Administered 2016-12-13: 1 via ORAL
  Filled 2016-12-13: qty 1

## 2016-12-13 MED ORDER — IBUPROFEN 400 MG PO TABS
600.0000 mg | ORAL_TABLET | Freq: Once | ORAL | Status: AC
  Administered 2016-12-13: 600 mg via ORAL
  Filled 2016-12-13: qty 1

## 2016-12-13 NOTE — ED Notes (Signed)
Patient stated they were stepping outside to wait for a minute.
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
## 2017-01-04 ENCOUNTER — Ambulatory Visit (HOSPITAL_COMMUNITY)
Admission: EM | Admit: 2017-01-04 | Discharge: 2017-01-04 | Disposition: A | Discharge status: Home / Self Care | Payer: Self-pay | Attending: Family Medicine | Admitting: Family Medicine

## 2017-01-04 ENCOUNTER — Encounter (HOSPITAL_COMMUNITY): Payer: Self-pay | Admitting: *Deleted

## 2017-01-04 DIAGNOSIS — Z760 Encounter for issue of repeat prescription: Secondary | ICD-10-CM

## 2017-01-04 DIAGNOSIS — R197 Diarrhea, unspecified: Secondary | ICD-10-CM

## 2017-01-04 HISTORY — DX: Type 2 diabetes mellitus without complications: E11.9

## 2017-01-04 MED ORDER — DICYCLOMINE HCL 20 MG PO TABS
20.0000 mg | ORAL_TABLET | Freq: Two times a day (BID) | ORAL | 0 refills | Status: DC
Start: 2017-01-04 — End: 2017-11-05

## 2017-01-04 MED ORDER — GLUCOSE BLOOD VI STRP: ORAL_STRIP | 12 refills | Status: AC

## 2017-01-04 MED ORDER — METFORMIN HCL 500 MG PO TABS: 500.0000 mg | ORAL_TABLET | Freq: Two times a day (BID) | ORAL | 0 refills | Status: DC

## 2017-01-04 MED ORDER — ONDANSETRON 4 MG PO TBDP
4.0000 mg | ORAL_TABLET | Freq: Three times a day (TID) | ORAL | 0 refills | Status: DC | PRN
Start: 2017-01-04 — End: 2017-11-05

## 2017-01-04 MED ORDER — LOPERAMIDE HCL 2 MG PO CAPS: 2.0000 mg | ORAL_CAPSULE | Freq: Four times a day (QID) | ORAL | 0 refills | Status: DC | PRN

## 2017-01-04 MED ORDER — ACURA BLOOD GLUCOSE METER W/DEVICE KIT: 1.0000 | PACK | Freq: Once | 0 refills | Status: AC
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
## 2017-01-04 NOTE — Discharge Instructions (Signed)
You most likely have viral gastritis. I prescribed medications to help your symptoms. Forr Nausea, I have prescribed Zofran, take 1 tablet under the tongue every 8 hours as needed. For abdominal pain and cramping I prescribed a medicine called Bentyl, take one tablet by mouth twice daily. And finally for diarrhea, I prescribed loperamide, one tablet after each loose stool, do not take more than 4 tablets a day, as this can cause constipation. Should your symptoms persist, or fail to improve, follow up with your primary care provider or return to clinic as needed. If your symptoms worsen, particularly if you develop worsened abdominal pain, fever, signs or symptoms of severe dehydration, then go to the emergency room.   I have provided refills on your glucose meter, test strips, and metformin, follow-up with Dr. Ferd HibbsMcKinsey for further management of this condition

## 2017-01-04 NOTE — ED Triage Notes (Signed)
Pt  Woke  Up  With   abd  Pain  Nausea  And  Diarrhea       Pt  States   Symptoms  Began      This   Am       Wife   Has  Similar   Symptoms   As   Well    Pt  Reports    Some   numnbness  And  Tingling  To    His  Feet  As   Well  Pt  Reports  To  Be  A  Diabetic  But  States     He  Has  Not  Been taking  His  meds  As  prescibed     Not  Actively  Vomiting  At this  Time

## 2017-01-14 ENCOUNTER — Emergency Department (HOSPITAL_COMMUNITY)
Admission: EM | Admit: 2017-01-14 | Discharge: 2017-01-14 | Disposition: A | Discharge status: Home / Self Care | Payer: Self-pay | Attending: Emergency Medicine | Admitting: Emergency Medicine

## 2017-01-14 ENCOUNTER — Encounter (HOSPITAL_COMMUNITY): Payer: Self-pay | Admitting: Nurse Practitioner

## 2017-01-14 DIAGNOSIS — Z5321 Procedure and treatment not carried out due to patient leaving prior to being seen by health care provider: Secondary | ICD-10-CM | POA: Insufficient documentation

## 2017-01-14 DIAGNOSIS — R2231 Localized swelling, mass and lump, right upper limb: Secondary | ICD-10-CM | POA: Insufficient documentation

## 2017-01-14 NOTE — ED Triage Notes (Signed)
Pt has multiple lesions; right flank, right hand that he states he suspects are from insect bites.

## 2017-01-15 ENCOUNTER — Ambulatory Visit (HOSPITAL_COMMUNITY)
Admission: EM | Admit: 2017-01-15 | Discharge: 2017-01-15 | Disposition: A | Discharge status: Home / Self Care | Payer: Self-pay | Attending: Family Medicine | Admitting: Family Medicine

## 2017-01-15 ENCOUNTER — Encounter (HOSPITAL_COMMUNITY): Payer: Self-pay | Admitting: Emergency Medicine

## 2017-01-15 DIAGNOSIS — W57XXXA Bitten or stung by nonvenomous insect and other nonvenomous arthropods, initial encounter: Secondary | ICD-10-CM

## 2017-01-15 DIAGNOSIS — J0110 Acute frontal sinusitis, unspecified: Secondary | ICD-10-CM

## 2017-01-15 DIAGNOSIS — R5381 Other malaise: Secondary | ICD-10-CM

## 2017-01-15 LAB — GLUCOSE, CAPILLARY: Glucose-Capillary: 101 mg/dL — ABNORMAL HIGH (ref 65–99)

## 2017-01-15 MED ORDER — FLUTICASONE PROPIONATE 50 MCG/ACT NA SUSP: 2.0000 | Freq: Every day | NASAL | 0 refills | Status: AC

## 2017-01-15 NOTE — ED Triage Notes (Signed)
[facility] with a complaint of possible spider bites on his back and hands that occurred 2 days ago.

## 2017-01-15 NOTE — ED Provider Notes (Signed)
[facility]    CSN: [id number] Arrival date & time: 01/15/17  1618     History   Chief Complaint Chief Complaint  Patient presents with  . Insect Bite    HPI Mark Farmer is a 41 y.o. male.   41 year old male with history of chronic back pain and diabetes comes in with 3 day history of headache, malaise, body aches. He also has insect bite on his body, that he is worried to be spider bites, as he has observed spiders at home. He took 2 doses of Augmentin that was at home which helped with the redness and swelling. Denies fever, chills, night sweats. Has some nasal drainage, and coughing that started today. Denies ear pain, eye pain, sore throat. Has had some nausea today, no vomiting. He continues to have diarrhea from which he was seen a week ago. Denies blood in stool, bloody urine. Patient is a diabetic, not taking his metformin, states he has not been taking it for the past 3 years. Does not check his blood glucose.      Past Medical History:  Diagnosis Date  . Chronic back pain   . Diabetes mellitus without complication (HCC)     There are no active problems to display for this patient.   History reviewed. No pertinent surgical history.     Home Medications    Prior to Admission medications   Medication Sig Start Date End Date Taking? Authorizing Provider  dicyclomine (BENTYL) 20 MG tablet Take 1 tablet (20 mg total) by mouth 2 (two) times daily. 01/04/17   Dorena Bodo, NP  fluticasone (FLONASE) 50 MCG/ACT nasal spray Place 2 sprays into both nostrils daily. 01/15/17   Belinda Fisher, PA-C  glucose blood [facility] BLOOD GLUCOSE TEST) test strip Use as instructed 01/04/17   Dorena Bodo, NP  loperamide (IMODIUM) 2 MG capsule Take 1 capsule (2 mg total) by mouth 4 (four) times daily as needed for diarrhea or loose stools. 01/04/17   Dorena Bodo, NP  metFORMIN (GLUCOPHAGE) 500 MG tablet Take 1 tablet (500 mg total) by mouth 2 (two) times daily with a  meal. 01/04/17   Dorena Bodo, NP  ondansetron (ZOFRAN ODT) 4 MG disintegrating tablet Take 1 tablet (4 mg total) by mouth every 8 (eight) hours as needed for nausea or vomiting. 01/04/17   Dorena Bodo, NP  oxyCODONE-acetaminophen (PERCOCET/ROXICET) 5-325 MG per tablet Take 2 tablets by mouth every 4 (four) hours as needed for severe pain. 06/16/14   Elson Areas, PA-C    Family History History reviewed. No pertinent family history.  Social History Social History  Substance Use Topics  . Smoking status: Current Some Day Smoker  . Smokeless tobacco: Never Used  . Alcohol use Yes     Allergies   Hydrocodone   Review of Systems Review of Systems  Reason unable to perform ROS: see HPI as above.     Physical Exam Triage Vital Signs ED Triage Vitals  Enc Vitals Group     BP 01/15/17 1709 (!) 156/92     Pulse Rate 01/15/17 1709 72     Resp 01/15/17 1709 16     Temp 01/15/17 1709 98.4 F (36.9 C)     Temp Source 01/15/17 1709 Oral     SpO2 01/15/17 1709 99 %     Weight --      Height --      Head Circumference --      Peak Flow --  Pain Score 01/15/17 1717 10     Pain Loc --      Pain Edu? --      Excl. in GC? --    No data found.   Updated Vital Signs BP (!) 156/92 (BP Location: Right Arm)   Pulse 72   Temp 98.4 F (36.9 C) (Oral)   Resp 16   SpO2 99%   Visual Acuity Right Eye Distance:   Left Eye Distance:   Bilateral Distance:    Right Eye Near:   Left Eye Near:    Bilateral Near:     Physical Exam  Constitutional: He is oriented to person, place, and time. He appears well-developed and well-nourished. No distress.  HENT:  Head: Normocephalic and atraumatic.  Right Ear: External ear and ear canal normal. Tympanic membrane is not erythematous and not bulging. A middle ear effusion is present.  Left Ear: External ear and ear canal normal. Tympanic membrane is not erythematous and not bulging. A middle ear effusion is present.  Nose: Right  sinus exhibits frontal sinus tenderness. Right sinus exhibits no maxillary sinus tenderness. Left sinus exhibits frontal sinus tenderness. Left sinus exhibits no maxillary sinus tenderness.  Mouth/Throat: Uvula is midline and mucous membranes are normal. Posterior oropharyngeal erythema present. No posterior oropharyngeal edema.  Eyes: Pupils are equal, round, and reactive to light. Conjunctivae are normal.  Neck: Normal range of motion. Neck supple.  Cardiovascular: Normal rate, regular rhythm and normal heart sounds.  Exam reveals no gallop and no friction rub.   No murmur heard. Pulmonary/Chest: Effort normal and breath sounds normal. He has no decreased breath sounds. He has no wheezes. He has no rhonchi. He has no rales.  Lymphadenopathy:    He has no cervical adenopathy.  Neurological: He is alert and oriented to person, place, and time.  Skin: Skin is warm and dry.  Multiple insect bite with wound, spreading throughout right thumb, lower back. Mild surrounding erythema, no increased warmth, induration felt.   Psychiatric: He has a normal mood and affect. His behavior is normal. Judgment normal.     UC Treatments / Results  Labs (all labs ordered are listed, but only abnormal results are displayed) Labs Reviewed  GLUCOSE, CAPILLARY - Abnormal; Notable for the following:       Result Value   Glucose-Capillary 101 (*)    All other components within normal limits    EKG  EKG Interpretation None       Radiology No results found.  Procedures Procedures (including critical care time)  Medications Ordered in UC Medications - No data to display   Initial Impression / Assessment and Plan / UC Course  I have reviewed the triage vital signs and the nursing notes.  Pertinent labs & imaging results that were available during my care of the patient were reviewed by me and considered in my medical decision making (see chart for details).    Start flonase for nasal congestion.  otc antihistamines and decongestant, nasal saline rinses. Take tylenol/ibuprofen for pain, fever, inflammation. Take probiotics for continued diarrhea. Discussed with patient insect bites without signs of infection, monitor for worsening of symptoms, fever, increased redness/warmth, discharge to follow up with PCP for reevaluation. Discussed with patient if experiencing worsening of headache, fever, body aches, abdominal pain to follow up with PCP for further evaluation needed.   Final Clinical Impressions(s) / UC Diagnoses   Final diagnoses:  Acute non-recurrent frontal sinusitis  Insect bite, initial encounter    New  Prescriptions Discharge Medication List as of 01/15/2017  6:04 PM    START taking these medications   Details  fluticasone (FLONASE) 50 MCG/ACT nasal spray Place 2 sprays into both nostrils daily., Starting Mon 01/15/2017, Normal          Belinda FisherYu, Mairin Lindsley V, PA-C 01/15/17 1813    Belinda FisherYu, Kala Ambriz V, PA-C 01/15/17 1815

## 2017-01-15 NOTE — Discharge Instructions (Signed)
Star flonase for nasal congestion. Take over the counter decongestant and/or allergy medicine to decrease nasal congestion. Start nasal saline rinse such as netipot. Take tylenol/motrin for pain and fever. Monitor for worsening of symptoms, fever, increased warmth/redness of your insect wounds, increased body aches, follow up at the emergency department for further evaluation.

## 2017-11-04 ENCOUNTER — Emergency Department (HOSPITAL_COMMUNITY): Payer: Self-pay

## 2017-11-04 ENCOUNTER — Emergency Department (HOSPITAL_COMMUNITY)
Admission: EM | Admit: 2017-11-04 | Discharge: 2017-11-05 | Disposition: A | Discharge status: Home / Self Care | Payer: Self-pay | Attending: Emergency Medicine | Admitting: Emergency Medicine

## 2017-11-04 ENCOUNTER — Encounter (HOSPITAL_COMMUNITY): Payer: Self-pay | Admitting: *Deleted

## 2017-11-04 DIAGNOSIS — S301XXA Contusion of abdominal wall, initial encounter: Secondary | ICD-10-CM | POA: Insufficient documentation

## 2017-11-04 DIAGNOSIS — Z79899 Other long term (current) drug therapy: Secondary | ICD-10-CM | POA: Insufficient documentation

## 2017-11-04 DIAGNOSIS — Y9389 Activity, other specified: Secondary | ICD-10-CM | POA: Insufficient documentation

## 2017-11-04 DIAGNOSIS — I10 Essential (primary) hypertension: Secondary | ICD-10-CM | POA: Insufficient documentation

## 2017-11-04 DIAGNOSIS — Y9289 Other specified places as the place of occurrence of the external cause: Secondary | ICD-10-CM | POA: Insufficient documentation

## 2017-11-04 DIAGNOSIS — Z720 Tobacco use: Secondary | ICD-10-CM | POA: Insufficient documentation

## 2017-11-04 DIAGNOSIS — S2020XA Contusion of thorax, unspecified, initial encounter: Secondary | ICD-10-CM | POA: Insufficient documentation

## 2017-11-04 DIAGNOSIS — Z7984 Long term (current) use of oral hypoglycemic drugs: Secondary | ICD-10-CM | POA: Insufficient documentation

## 2017-11-04 DIAGNOSIS — E119 Type 2 diabetes mellitus without complications: Secondary | ICD-10-CM | POA: Insufficient documentation

## 2017-11-04 DIAGNOSIS — S20219A Contusion of unspecified front wall of thorax, initial encounter: Secondary | ICD-10-CM

## 2017-11-04 DIAGNOSIS — Y999 Unspecified external cause status: Secondary | ICD-10-CM | POA: Insufficient documentation

## 2017-11-04 LAB — CBC
HEMATOCRIT: 43.3 % (ref 39.0–52.0)
HEMOGLOBIN: 15 g/dL (ref 13.0–17.0)
MCH: 31.4 pg (ref 26.0–34.0)
MCHC: 34.6 g/dL (ref 30.0–36.0)
MCV: 90.6 fL (ref 78.0–100.0)
Platelets: 280 10*3/uL (ref 150–400)
RBC: 4.78 MIL/uL (ref 4.22–5.81)
RDW: 12.5 % (ref 11.5–15.5)
WBC: 9.9 10*3/uL (ref 4.0–10.5)

## 2017-11-04 LAB — URINALYSIS, ROUTINE W REFLEX MICROSCOPIC
BILIRUBIN URINE: NEGATIVE
Glucose, UA: NEGATIVE mg/dL
Hgb urine dipstick: NEGATIVE
KETONES UR: 5 mg/dL — AB
LEUKOCYTES UA: NEGATIVE
NITRITE: NEGATIVE
PH: 6 (ref 5.0–8.0)
PROTEIN: NEGATIVE mg/dL
Specific Gravity, Urine: 1.023 (ref 1.005–1.030)

## 2017-11-04 LAB — COMPREHENSIVE METABOLIC PANEL
ALBUMIN: 4.5 g/dL (ref 3.5–5.0)
ALK PHOS: 58 U/L (ref 38–126)
ALT: 28 U/L (ref 17–63)
ANION GAP: 12 (ref 5–15)
AST: 24 U/L (ref 15–41)
BILIRUBIN TOTAL: 0.6 mg/dL (ref 0.3–1.2)
BUN: 17 mg/dL (ref 6–20)
CHLORIDE: 104 mmol/L (ref 101–111)
CO2: 24 mmol/L (ref 22–32)
Calcium: 9.5 mg/dL (ref 8.9–10.3)
Creatinine, Ser: 1.38 mg/dL — ABNORMAL HIGH (ref 0.61–1.24)
GFR calc Af Amer: >60 mL/min (ref >60)
GFR calc non Af Amer: >60 mL/min (ref >60)
Glucose, Bld: 166 mg/dL — ABNORMAL HIGH (ref 65–99)
POTASSIUM: 3.3 mmol/L — AB (ref 3.5–5.1)
Sodium: 140 mmol/L (ref 135–145)
Total Protein: 7.6 g/dL (ref 6.5–8.1)

## 2017-11-04 LAB — TYPE AND SCREEN
ABO/RH(D): O POS
Antibody Screen: NEGATIVE

## 2017-11-04 LAB — ABO/RH: ABO/RH(D): O POS

## 2017-11-04 MED ORDER — HYDROMORPHONE HCL 1 MG/ML IJ SOLN
1.0000 mg | Freq: Once | INTRAMUSCULAR | Status: AC
  Administered 2017-11-04: 1 mg via INTRAVENOUS
  Filled 2017-11-04: qty 1

## 2017-11-04 MED ORDER — SODIUM CHLORIDE 0.9 % IV SOLN
INTRAVENOUS | Status: DC
  Administered 2017-11-04: 22:00:00 via INTRAVENOUS

## 2017-11-04 MED ORDER — ONDANSETRON HCL 4 MG/2ML IJ SOLN
4.0000 mg | Freq: Once | INTRAMUSCULAR | Status: AC
Start: 2017-11-04 — End: 2017-11-04
  Administered 2017-11-04: 4 mg via INTRAVENOUS
  Filled 2017-11-04: qty 2

## 2017-11-04 MED ORDER — IOPAMIDOL (ISOVUE-300) INJECTION 61%
100.0000 mL | Freq: Once | INTRAVENOUS | Status: AC | PRN
  Administered 2017-11-04: 100 mL via INTRAVENOUS

## 2017-11-04 MED ORDER — IOPAMIDOL (ISOVUE-300) INJECTION 61%
INTRAVENOUS | Status: AC
  Administered 2017-11-04: 23:00:00
  Filled 2017-11-04: qty 100

## 2017-11-04 NOTE — ED Notes (Signed)
Bed: WA09 Expected date:  Expected time:  Means of arrival:  Comments: Triage 3 
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
## 2017-11-04 NOTE — Discharge Instructions (Addendum)
It was our pleasure to provide your ER care today - we hope that you feel better.  Take acetaminophen as need for pain.   Your blood pressure is high - see information sheets - follow up with primary care doctor for recheck of blood pressure in the coming week.  Return to ER if worse, new symptoms, new or severe pain, other concern.
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
## 2017-11-05 MED ORDER — TRAMADOL HCL 50 MG PO TABS: 50.0000 mg | ORAL_TABLET | Freq: Four times a day (QID) | ORAL | 0 refills | Status: DC | PRN
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
## 2018-03-27 ENCOUNTER — Ambulatory Visit (HOSPITAL_COMMUNITY)
Admission: EM | Admit: 2018-03-27 | Discharge: 2018-03-27 | Disposition: A | Discharge status: Home / Self Care | Payer: Medicaid Other | Attending: Family Medicine | Admitting: Family Medicine

## 2018-03-27 ENCOUNTER — Encounter (HOSPITAL_COMMUNITY): Payer: Self-pay

## 2018-03-27 ENCOUNTER — Other Ambulatory Visit: Payer: Self-pay

## 2018-03-27 DIAGNOSIS — E1165 Type 2 diabetes mellitus with hyperglycemia: Secondary | ICD-10-CM | POA: Diagnosis not present

## 2018-03-27 DIAGNOSIS — R350 Frequency of micturition: Secondary | ICD-10-CM | POA: Diagnosis present

## 2018-03-27 DIAGNOSIS — R5383 Other fatigue: Secondary | ICD-10-CM | POA: Diagnosis present

## 2018-03-27 DIAGNOSIS — F17200 Nicotine dependence, unspecified, uncomplicated: Secondary | ICD-10-CM | POA: Insufficient documentation

## 2018-03-27 DIAGNOSIS — Z885 Allergy status to narcotic agent status: Secondary | ICD-10-CM | POA: Diagnosis not present

## 2018-03-27 LAB — POCT I-STAT, CHEM 8
BUN: 19 mg/dL (ref 6–20)
CREATININE: 1.2 mg/dL (ref 0.61–1.24)
Calcium, Ion: 1.16 mmol/L (ref 1.15–1.40)
Chloride: 95 mmol/L — ABNORMAL LOW (ref 98–111)
Glucose, Bld: 466 mg/dL — ABNORMAL HIGH (ref 70–99)
HEMATOCRIT: 50 % (ref 39.0–52.0)
HEMOGLOBIN: 17 g/dL (ref 13.0–17.0)
Potassium: 3.9 mmol/L (ref 3.5–5.1)
SODIUM: 131 mmol/L — AB (ref 135–145)
TCO2: 28 mmol/L (ref 22–32)

## 2018-03-27 LAB — HEMOGLOBIN A1C
HEMOGLOBIN A1C: 11 % — AB (ref 4.8–5.6)
MEAN PLASMA GLUCOSE: 269 mg/dL

## 2018-03-27 LAB — GLUCOSE, CAPILLARY: Glucose-Capillary: 388 mg/dL — ABNORMAL HIGH (ref 70–99)

## 2018-03-27 MED ORDER — INSULIN ASPART 100 UNIT/ML ~~LOC~~ SOLN
10.0000 [IU] | Freq: Once | SUBCUTANEOUS | Status: AC
  Administered 2018-03-27: 10 [IU] via SUBCUTANEOUS

## 2018-03-27 MED ORDER — INSULIN GLARGINE 100 UNIT/ML ~~LOC~~ SOLN: 10.0000 [IU] | Freq: Every day | SUBCUTANEOUS | 1 refills | Status: AC

## 2018-03-27 MED ORDER — INSULIN ASPART 100 UNIT/ML ~~LOC~~ SOLN
SUBCUTANEOUS | Status: AC
  Filled 2018-03-27: qty 1

## 2018-03-27 MED ORDER — BLOOD GLUCOSE MONITOR KIT: PACK | 0 refills | Status: AC

## 2018-03-27 NOTE — ED Triage Notes (Signed)
Pt states she has been with his diabetic meds for a long while.

## 2018-03-27 NOTE — Discharge Instructions (Addendum)
Please do your best to keep a log of your blood sugars. Check your blood sugar before meals or 2 hours after eating as well as in the morning when you haven't had anything to eat for at least 8 hours.

## 2018-03-27 NOTE — ED Provider Notes (Signed)
Mark Farmer   [id number] 03/27/18 Arrival Time: 0867  ASSESSMENT & PLAN:  1. Uncontrolled type 2 diabetes mellitus with hyperglycemia ([name])    Start: Meds ordered this encounter  Medications  . blood glucose meter kit and supplies KIT    Sig: Dispense based on patient and insurance preference. Use up to four times daily as directed. (FOR ICD-9 250.00, 250.01).    Dispense:  1 each    Refill:  0    Order Specific Question:   Number of strips    Answer:   100    Order Specific Question:   Number of lancets    Answer:   100  . insulin glargine (LANTUS) 100 UNIT/ML injection    Sig: Inject 0.1 mLs (10 Units total) into the skin daily.    Dispense:  10 mL    Refill:  1    Follow-up Information    [place].   Contact information: [place] [phone]       Primary Care at [facility].   Specialty:  Family Medicine Contact information: [address] [place] [phone]         May f/u here within the next week if unable to est with PCP. Awaiting HgA1c. Discussed importance of finding a PCP as quickly as possible.  Reviewed expectations re: course of current medical issues. Questions answered. Outlined signs and symptoms indicating need for more acute intervention. Patient verbalized understanding. After Visit Summary given.   SUBJECTIVE: History from: patient. Mark Farmer is a 42 y.o. male who presents with complaint of urinary frequency, occasional blurry vision, fatigue, increased thirst for the past several weeks, maybe longer. Dx with DM approx 2 years ago. Given metformin which he took for one week. Stopped secondary to reported intolerance. No diet changes. No f/u since then. No recent illnesses or medication changes. No n/v. Tolerating PO intake. Normal bowel/bladder habits. No CP/SOB reported. No LE edema.  ROS: As per  HPI.   OBJECTIVE:  Vitals:   03/27/18 1453 03/27/18 1455  BP: (!) 151/98   Pulse: 90   Resp: 18   Temp: 98.4 F (36.9 C)   SpO2: 100%   Weight:  102.1 kg    General appearance: alert; no distress Eyes: PERRLA; EOMI; conjunctiva normal HENT: normocephalic; atraumatic Lungs: clear to auscultation bilaterally; unlabored Heart: regular rate and rhythm without murmer Abdomen: soft, non-tender; bowel sounds normal Extremities: no edema; symmetrical with no gross deformities Skin: warm and dry Neurologic: normal gait; normal symmetric reflexes and sensation of extremities Psychological: alert and cooperative; normal mood and affect  Labs: Results for orders placed or performed during the hospital encounter of 03/27/18  I-STAT, chem 8  Result Value Ref Range   Sodium 131 (L) 135 - 145 mmol/L   Potassium 3.9 3.5 - 5.1 mmol/L   Chloride 95 (L) 98 - 111 mmol/L   BUN 19 6 - 20 mg/dL   Creatinine, Ser 1.20 0.61 - 1.24 mg/dL   Glucose, Bld 466 (H) 70 - 99 mg/dL   Calcium, Ion 1.16 1.15 - 1.40 mmol/L   TCO2 28 22 - 32 mmol/L   Hemoglobin 17.0 13.0 - 17.0 g/dL   HCT 50.0 39.0 - 52.0 %   Labs Reviewed  POCT I-STAT, CHEM 8 - Abnormal; Notable for the following components:      Result Value   Sodium 131 (*)    Chloride 95 (*)  Glucose, Bld 466 (*)    All other components within normal limits  HEMOGLOBIN A1C    Allergies  Allergen Reactions  . Prilosec [Omeprazole] Shortness Of Breath and Swelling  . Hydrocodone Diarrhea and Nausea And Vomiting    Pt states Hydrocodone sometimes causes diarrhea, vomiting     Past Medical History:  Diagnosis Date  . Chronic back pain   . Diabetes mellitus without complication [facility])    Social History   Socioeconomic History  . Marital status: Married    Spouse name: Not on file  . Number of children: Not on file  . Years of education: Not on file  . Highest education level: Not on file  Occupational History  . Not on file  Social  Needs  . Financial resource strain: Not on file  . Food insecurity:    Worry: Not on file    Inability: Not on file  . Transportation needs:    Medical: Not on file    Non-medical: Not on file  Tobacco Use  . Smoking status: Current Some Day Smoker  . Smokeless tobacco: Never Used  Substance and Sexual Activity  . Alcohol use: Yes  . Drug use: No  . Sexual activity: Not on file  Lifestyle  . Physical activity:    Days per week: Not on file    Minutes per session: Not on file  . Stress: Not on file  Relationships  . Social connections:    Talks on phone: Not on file    Gets together: Not on file    Attends religious service: Not on file    Active member of club or organization: Not on file    Attends meetings of clubs or organizations: Not on file    Relationship status: Not on file  . Intimate partner violence:    Fear of current or ex partner: Not on file    Emotionally abused: Not on file    Physically abused: Not on file    Forced sexual activity: Not on file  Other Topics Concern  . Not on file  Social History Narrative  . Not on file   FH: No known diabetes in family members.  History reviewed. No pertinent surgical history.   Mark Kick, MD 03/27/18 [phone]

## 2018-03-28 ENCOUNTER — Telehealth: Payer: Self-pay | Admitting: Emergency Medicine

## 2018-03-28 MED FILL — TRUE METRIX TEST STRIP: 25 days supply | Qty: 100 | Fill #0

## 2018-03-28 MED FILL — TRUEplus LANCETS 28G MISC: 25 days supply | Qty: 100 | Fill #0

## 2018-03-28 MED FILL — !LANTUS 100 UNITS/ML VIAL: 100 | 28 days supply | Qty: 10 | Fill #0

## 2018-03-28 MED FILL — !TRUE METRIX BLOOD GLUCOSE: 365 days supply | Qty: 1 | Fill #0

## 2018-03-28 NOTE — Telephone Encounter (Signed)
Called patient, no answer, left a generic message to return the call.

## 2018-04-01 ENCOUNTER — Telehealth (HOSPITAL_COMMUNITY): Payer: Self-pay

## 2018-04-01 NOTE — Telephone Encounter (Signed)
Patient called. Aware of results. Stated that he stopped taking his insulin and has been working out and eating better. Encouraged patient to follow up with his PCP. Instructed to patient that even with a healthy diet and exercise plan the patient may still need to be on medication and should be monitored by a PCP to ensure a proper treatment plan for his diabetes. Patient had questions on what type of Diabetes he has. Educated patient that his doctor can diagnosis him with the type of Diabetes that he has. Patient verbalized understanding.

## 2019-03-10 ENCOUNTER — Encounter (HOSPITAL_COMMUNITY): Payer: Self-pay | Admitting: Family Medicine

## 2019-03-10 ENCOUNTER — Ambulatory Visit (HOSPITAL_COMMUNITY)
Admission: EM | Admit: 2019-03-10 | Discharge: 2019-03-10 | Disposition: A | Discharge status: Home / Self Care | Payer: Medicaid Other | Attending: Family Medicine | Admitting: Family Medicine

## 2019-03-10 ENCOUNTER — Other Ambulatory Visit: Payer: Self-pay

## 2019-03-10 DIAGNOSIS — J029 Acute pharyngitis, unspecified: Secondary | ICD-10-CM | POA: Diagnosis not present

## 2019-03-10 DIAGNOSIS — F17200 Nicotine dependence, unspecified, uncomplicated: Secondary | ICD-10-CM | POA: Diagnosis not present

## 2019-03-10 DIAGNOSIS — R5383 Other fatigue: Secondary | ICD-10-CM | POA: Insufficient documentation

## 2019-03-10 DIAGNOSIS — Z77120 Contact with and (suspected) exposure to mold (toxic): Secondary | ICD-10-CM | POA: Insufficient documentation

## 2019-03-10 DIAGNOSIS — J449 Chronic obstructive pulmonary disease, unspecified: Secondary | ICD-10-CM | POA: Insufficient documentation

## 2019-03-10 DIAGNOSIS — Z20828 Contact with and (suspected) exposure to other viral communicable diseases: Secondary | ICD-10-CM | POA: Diagnosis not present

## 2019-03-10 DIAGNOSIS — E119 Type 2 diabetes mellitus without complications: Secondary | ICD-10-CM | POA: Diagnosis not present

## 2019-03-10 DIAGNOSIS — Z794 Long term (current) use of insulin: Secondary | ICD-10-CM | POA: Insufficient documentation

## 2019-03-10 DIAGNOSIS — Z79899 Other long term (current) drug therapy: Secondary | ICD-10-CM | POA: Diagnosis not present

## 2019-03-10 NOTE — ED Provider Notes (Signed)
[place]    CSN: [id number] Arrival date & time: 03/10/19  0909      History   Chief Complaint Chief Complaint  Patient presents with  . mold exp    HPI Mark Farmer is a 43 y.o. male.   Patient is a 43 year old male with past medical history of diabetes, back pain.  He presents today for possible about exposure.  Reporting mold in his basement.  He has been having symptoms to include fatigue, headaches, sore throat.  Feels like the symptoms resolve when he is out of the house for a while.  Denies any fevers or cough.  Denies any recent sick contacts or recent traveling.  Reporting history of COPD but none recorded in chart.  He is a current everyday smoker.  Wife has had similar symptoms and lives in the same house.  No nausea, vomiting, diarrhea or abdominal pain.  ROS per HPI      Past Medical History:  Diagnosis Date  . Chronic back pain   . Diabetes mellitus without complication ([place])     There are no active problems to display for this patient.   History reviewed. No pertinent surgical history.     Home Medications    Prior to Admission medications   Medication Sig Start Date End Date Taking? Authorizing Provider  blood glucose meter kit and supplies KIT Dispense based on patient and insurance preference. Use up to four times daily as directed. (FOR ICD-9 250.00, 250.01). 03/27/18   Vanessa Kick, MD  clonazePAM (KLONOPIN) 1 MG tablet Take 1 mg by mouth every 8 (eight) hours as needed for anxiety.  10/14/17   [provider]  fluticasone (FLONASE) 50 MCG/ACT nasal spray Place 2 sprays into both nostrils daily. 01/15/17   Tasia Catchings, Amy V, PA-C  glucose blood [facility] BLOOD GLUCOSE TEST) test strip Use as instructed 01/04/17   Barnet Glasgow, NP  insulin glargine (LANTUS) 100 UNIT/ML injection Inject 0.1 mLs (10 Units total) into the skin daily. 03/27/18   Vanessa Kick, MD  Oxycodone HCl 10 MG TABS Take 10 mg by mouth every 8 (eight) hours as  needed (pain).  10/12/17   [provider]  traMADol (ULTRAM) 50 MG tablet Take 1 tablet (50 mg total) by mouth every 6 (six) hours as needed. 11/05/17   Orpah Greek, MD    Family History History reviewed. No pertinent family history.  Social History Social History   Tobacco Use  . Smoking status: Current Some Day Smoker  . Smokeless tobacco: Never Used  Substance Use Topics  . Alcohol use: Yes  . Drug use: No     Allergies   Prilosec [omeprazole] and Hydrocodone   Review of Systems Review of Systems   Physical Exam Triage Vital Signs ED Triage Vitals  Enc Vitals Group     BP 03/10/19 0943 (!) 158/98     Pulse Rate 03/10/19 0943 74     Resp 03/10/19 0943 18     Temp --      Temp Source 03/10/19 0943 Oral     SpO2 03/10/19 0943 100 %     Weight 03/10/19 0947 230 lb (104.3 kg)     Height --      Head Circumference --      Peak Flow --      Pain Score 03/10/19 0947 5     Pain Loc --      Pain Edu? --      Excl. in  GC? --    No data found.  Updated Vital Signs BP (!) 158/98 (BP Location: Right Arm)   Pulse 74   Resp 18   Wt 230 lb (104.3 kg)   SpO2 100%   BMI 33.00 kg/m   Visual Acuity Right Eye Distance:   Left Eye Distance:   Bilateral Distance:    Right Eye Near:   Left Eye Near:    Bilateral Near:     Physical Exam Vitals signs and nursing note reviewed.  Constitutional:      General: He is not in acute distress.    Appearance: Normal appearance. He is not ill-appearing, toxic-appearing or diaphoretic.  HENT:     Head: Normocephalic and atraumatic.     Right Ear: Tympanic membrane and ear canal normal.     Left Ear: Tympanic membrane and ear canal normal.     Nose: Nose normal.     Mouth/Throat:     Pharynx: Posterior oropharyngeal erythema present.  Eyes:     Conjunctiva/sclera: Conjunctivae normal.  Neck:     Musculoskeletal: Normal range of motion.  Cardiovascular:     Pulses: Normal pulses.     Heart sounds:  Normal heart sounds.  Pulmonary:     Effort: Pulmonary effort is normal.     Breath sounds: Normal breath sounds.  Musculoskeletal: Normal range of motion.  Skin:    General: Skin is warm and dry.  Neurological:     Mental Status: He is alert.  Psychiatric:        Mood and Affect: Mood normal.      UC Treatments / Results  Labs (all labs ordered are listed, but only abnormal results are displayed) Labs Reviewed  NOVEL CORONAVIRUS, NAA (HOSP ORDER, SEND-OUT TO REF LAB; TAT 18-24 HRS)    EKG   Radiology No results found.  Procedures Procedures (including critical care time)  Medications Ordered in UC Medications - No data to display  Initial Impression / Assessment and Plan / UC Course  I have reviewed the triage vital signs and the nursing notes.  Pertinent labs & imaging results that were available during my care of the patient were reviewed by me and considered in my medical decision making (see chart for details).     Fatigue, headache, possible mold exposure-reassured patient there is no specific testing for this.  Best scenario would be to remove himself from the house where he has been exposed or have somebody remove the mole..  This should improve his symptoms. COVID testing done for precaution Recommend follow-up with PCP as needed   Final Clinical Impressions(s) / UC Diagnoses   Final diagnoses:  Fatigue, unspecified type  Mold exposure     Discharge Instructions     Your symptoms could be related to mold exposure.   We are testing you for coverages for precaution. Follow up as needed for continued or worsening symptoms     ED Prescriptions    None     PDMP not reviewed this encounter.   Loura Halt A, NP 03/10/19 1027

## 2019-03-10 NOTE — Discharge Instructions (Signed)
Your symptoms could be related to mold exposure.   We are testing you for coverages for precaution. Follow up as needed for continued or worsening symptoms

## 2019-03-10 NOTE — ED Triage Notes (Signed)
Pt states she has been exp to mold. Pt states he has been feeling fatigued.

## 2019-03-12 ENCOUNTER — Telehealth: Payer: Self-pay | Admitting: Family Medicine

## 2019-03-12 LAB — NOVEL CORONAVIRUS, NAA (HOSP ORDER, SEND-OUT TO REF LAB; TAT 18-24 HRS): SARS-CoV-2, NAA: NOT DETECTED

## 2019-03-12 NOTE — Telephone Encounter (Signed)
Patient called in and was informed of negative test results.  

## 2019-09-10 ENCOUNTER — Ambulatory Visit: Payer: Medicaid Other | Attending: Nurse Practitioner | Admitting: Physical Therapy

## 2019-09-10 ENCOUNTER — Other Ambulatory Visit: Payer: Self-pay

## 2019-09-10 ENCOUNTER — Encounter: Payer: Self-pay | Admitting: Physical Therapy

## 2019-09-10 DIAGNOSIS — M545 Low back pain, unspecified: Secondary | ICD-10-CM

## 2019-09-10 DIAGNOSIS — G8929 Other chronic pain: Secondary | ICD-10-CM | POA: Insufficient documentation

## 2019-09-10 DIAGNOSIS — M6281 Muscle weakness (generalized): Secondary | ICD-10-CM | POA: Insufficient documentation

## 2019-09-10 DIAGNOSIS — M546 Pain in thoracic spine: Secondary | ICD-10-CM | POA: Insufficient documentation

## 2019-09-10 DIAGNOSIS — M62830 Muscle spasm of back: Secondary | ICD-10-CM | POA: Diagnosis present
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
## 2020-01-29 ENCOUNTER — Telehealth: Payer: Self-pay | Admitting: Hematology

## 2020-01-29 NOTE — Telephone Encounter (Signed)
Received a new hem referral from [facility] for Hereditary deficiency of other clotting factors. Mark Farmer has been scheduled to see Dr. Candise Che on 9/21 at 1pm. Letter mailed.

## 2020-02-24 ENCOUNTER — Inpatient Hospital Stay: Payer: No Typology Code available for payment source | Admitting: Hematology

## 2021-03-28 ENCOUNTER — Emergency Department (HOSPITAL_COMMUNITY)
Admission: EM | Admit: 2021-03-28 | Discharge: 2021-03-28 | Disposition: A | Discharge status: Home / Self Care | Payer: Medicaid Other | Attending: Emergency Medicine | Admitting: Emergency Medicine

## 2021-03-28 ENCOUNTER — Emergency Department (HOSPITAL_COMMUNITY): Payer: Medicaid Other

## 2021-03-28 ENCOUNTER — Other Ambulatory Visit: Payer: Self-pay

## 2021-03-28 DIAGNOSIS — S161XXA Strain of muscle, fascia and tendon at neck level, initial encounter: Secondary | ICD-10-CM | POA: Insufficient documentation

## 2021-03-28 DIAGNOSIS — Z794 Long term (current) use of insulin: Secondary | ICD-10-CM | POA: Insufficient documentation

## 2021-03-28 DIAGNOSIS — E119 Type 2 diabetes mellitus without complications: Secondary | ICD-10-CM | POA: Diagnosis not present

## 2021-03-28 DIAGNOSIS — S46911A Strain of unspecified muscle, fascia and tendon at shoulder and upper arm level, right arm, initial encounter: Secondary | ICD-10-CM | POA: Insufficient documentation

## 2021-03-28 DIAGNOSIS — S7001XA Contusion of right hip, initial encounter: Secondary | ICD-10-CM

## 2021-03-28 DIAGNOSIS — S40911A Unspecified superficial injury of right shoulder, initial encounter: Secondary | ICD-10-CM | POA: Diagnosis present

## 2021-03-28 MED ORDER — IBUPROFEN 400 MG PO TABS
600.0000 mg | ORAL_TABLET | Freq: Once | ORAL | Status: AC
  Administered 2021-03-28: 600 mg via ORAL
  Filled 2021-03-28: qty 1

## 2021-03-28 NOTE — ED Notes (Signed)
Patient transported to X-ray 

## 2021-03-28 NOTE — ED Notes (Signed)
Pt stated he is hurting from head to toe mainly on the right side of body. He has +2 radial and dorsal pedis pulses bilaterally.

## 2021-03-28 NOTE — Discharge Instructions (Addendum)
Use Tylenol every 4 hours and ibuprofen every 6 hours as needed for pain.  Ice as needed during the day. Follow up official radiology reads tomorrow with primary doctor.

## 2021-03-28 NOTE — ED Notes (Signed)
Pt outside to smoke
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
## 2021-03-28 NOTE — ED Notes (Signed)
Pt ambulated to the restroom with a steady gait.
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
## 2021-03-28 NOTE — ED Notes (Signed)
Follow-up with Xray to see if Radiologist read the results. Xray technician stated, "No, it will probably be another 40-45 minutes." I will follow-up with the MD

## 2021-03-28 NOTE — ED Notes (Signed)
Patient verbalizes understanding of discharge instructions. Opportunity for questioning and answers were provided. Armband removed by staff, pt discharged from ED ambulatory.   

## 2021-03-28 NOTE — ED Notes (Signed)
Pt discharge instructions reviewed with pt by provider

## 2021-04-02 ENCOUNTER — Ambulatory Visit (HOSPITAL_COMMUNITY)
Admission: EM | Admit: 2021-04-02 | Discharge: 2021-04-02 | Disposition: A | Discharge status: Home / Self Care | Payer: Medicaid Other | Attending: Emergency Medicine | Admitting: Emergency Medicine

## 2021-04-02 ENCOUNTER — Other Ambulatory Visit: Payer: Self-pay

## 2021-04-02 ENCOUNTER — Encounter (HOSPITAL_COMMUNITY): Payer: Self-pay | Admitting: *Deleted

## 2021-04-02 DIAGNOSIS — M7918 Myalgia, other site: Secondary | ICD-10-CM | POA: Diagnosis not present

## 2021-04-02 MED ORDER — KETOROLAC TROMETHAMINE 60 MG/2ML IM SOLN
60.0000 mg | Freq: Once | INTRAMUSCULAR | Status: AC
  Administered 2021-04-02: 60 mg via INTRAMUSCULAR

## 2021-04-02 MED ORDER — IBUPROFEN 800 MG PO TABS: 800.0000 mg | ORAL_TABLET | Freq: Three times a day (TID) | ORAL | 0 refills | Status: DC

## 2021-04-02 MED ORDER — KETOROLAC TROMETHAMINE 60 MG/2ML IM SOLN
INTRAMUSCULAR | Status: AC
  Filled 2021-04-02: qty 2

## 2021-04-02 MED ORDER — BACLOFEN 10 MG PO TABS: 10.0000 mg | ORAL_TABLET | Freq: Three times a day (TID) | ORAL | 0 refills | Status: AC
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
## 2021-04-02 NOTE — Discharge Instructions (Addendum)
Below is the name of a chiropractor that she may find helpful.  Please continue to take oxycodone 15 mg as prescribed, begin Medrol Dosepak, take 1 row of tablets daily and baclofen as needed for a time inflammatory pain relief and muscle relaxation.  [facility] [address] [place] [address] [phone]

## 2021-05-17 ENCOUNTER — Other Ambulatory Visit: Payer: Self-pay

## 2021-11-17 ENCOUNTER — Telehealth: Payer: Medicaid Other | Admitting: Family Medicine

## 2021-11-17 ENCOUNTER — Telehealth: Payer: Medicaid Other | Admitting: Physician Assistant

## 2021-11-17 DIAGNOSIS — G8929 Other chronic pain: Secondary | ICD-10-CM | POA: Diagnosis not present

## 2021-11-17 DIAGNOSIS — M25521 Pain in right elbow: Secondary | ICD-10-CM | POA: Diagnosis not present

## 2021-11-17 DIAGNOSIS — M5441 Lumbago with sciatica, right side: Secondary | ICD-10-CM

## 2021-11-17 DIAGNOSIS — M549 Dorsalgia, unspecified: Secondary | ICD-10-CM

## 2021-11-17 MED ORDER — METHYLPREDNISOLONE 4 MG PO TBPK: ORAL_TABLET | ORAL | 0 refills | Status: AC
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
## 2021-11-17 NOTE — Patient Instructions (Signed)
  Mark Farmer, thank you for joining [name], PA-C for today's virtual visit.  While this provider is not your primary care provider (PCP), if your PCP is located in our provider database this encounter information will be shared with them immediately following your visit.  Consent: (Patient) Mark Farmer provided verbal consent for this virtual visit at the beginning of the encounter.  Current Medications:  Current Outpatient Medications:    baclofen (LIORESAL) 10 MG tablet, Take 1 tablet (10 mg total) by mouth 3 (three) times daily., Disp: 30 each, Rfl: 0   blood glucose meter kit and supplies KIT, Dispense based on patient and insurance preference. Use up to four times daily as directed. (FOR ICD-9 250.00, 250.01)., Disp: 1 each, Rfl: 0   clonazePAM (KLONOPIN) 1 MG tablet, Take 1 mg by mouth every 8 (eight) hours as needed for anxiety. , Disp: , Rfl: 0   fluticasone (FLONASE) 50 MCG/ACT nasal spray, Place 2 sprays into both nostrils daily., Disp: 1 g, Rfl: 0   glucose blood (ACURA BLOOD GLUCOSE TEST) test strip, Use as instructed, Disp: 100 each, Rfl: 12   ibuprofen (ADVIL) 800 MG tablet, Take 1 tablet (800 mg total) by mouth 3 (three) times daily., Disp: 21 tablet, Rfl: 0   insulin glargine (LANTUS) 100 UNIT/ML injection, Inject 0.1 mLs (10 Units total) into the skin daily., Disp: 10 mL, Rfl: 1   Medications ordered in this encounter:  No orders of the defined types were placed in this encounter.    *If you need refills on other medications prior to your next appointment, please contact your pharmacy*  Follow-Up: Call back or seek an in-person evaluation if the symptoms worsen or if the condition fails to improve as anticipated.  Other Instructions Avoid heavy lifting.  Ok to continue OTC medications. Take the steroid pack as directed.   Use the link below to get scheduled with a local PCP.  The list below is so you can see an Orthopedist within the next week  without a referral or appt:     If you have been instructed to have an in-person evaluation today at a local Urgent Care facility, please use the link below. It will take you to a list of all of our available [facility], including address, phone number and hours of operation. Please do not delay care.  [facility]  If you or a family member do not have a primary care provider, use the link below to schedule a visit and establish care. When you choose a [facility] primary care physician or advanced practice provider, you gain a long-term partner in health. Find a Primary Care Provider  Learn more about [facility]'s in-office and virtual care options: [facility]

## 2021-11-17 NOTE — Progress Notes (Signed)
[place]   Reports of back pain this is markedly worsen than usual. Needs in person eval for this.  Message sent

## 2022-06-28 ENCOUNTER — Other Ambulatory Visit: Payer: Self-pay

## 2022-06-28 ENCOUNTER — Encounter (HOSPITAL_COMMUNITY): Payer: Self-pay | Admitting: Emergency Medicine

## 2022-06-28 ENCOUNTER — Emergency Department (HOSPITAL_COMMUNITY): Payer: Medicaid Other

## 2022-06-28 ENCOUNTER — Emergency Department (HOSPITAL_COMMUNITY)
Admission: EM | Admit: 2022-06-28 | Discharge: 2022-06-29 | Disposition: A | Discharge status: Home / Self Care | Payer: Medicaid Other | Attending: Emergency Medicine | Admitting: Emergency Medicine

## 2022-06-28 DIAGNOSIS — W260XXA Contact with knife, initial encounter: Secondary | ICD-10-CM | POA: Diagnosis not present

## 2022-06-28 DIAGNOSIS — S61210A Laceration without foreign body of right index finger without damage to nail, initial encounter: Secondary | ICD-10-CM | POA: Insufficient documentation

## 2022-06-28 DIAGNOSIS — S6991XA Unspecified injury of right wrist, hand and finger(s), initial encounter: Secondary | ICD-10-CM | POA: Diagnosis present

## 2022-06-28 MED ORDER — TETANUS-DIPHTH-ACELL PERTUSSIS 5-2.5-18.5 LF-MCG/0.5 IM SUSY
0.5000 mL | PREFILLED_SYRINGE | Freq: Once | INTRAMUSCULAR | Status: DC
  Filled 2022-06-28: qty 0.5

## 2022-06-28 NOTE — ED Provider Triage Note (Signed)
Emergency Medicine Provider Triage Evaluation Note  Mark Farmer , a 47 y.o. male  was evaluated in triage.  Pt complains of right finger laceration. Cut finger with knife while cooking. Last tetanus 2018. Right hand dominant.   Review of Systems  Positive: wound Negative: numbness  Physical Exam  BP (!) 148/89 (BP Location: Left Arm)   Pulse (!) 107   Temp 98.3 F (36.8 C) (Oral)   Resp 18   Wt 92.5 kg   SpO2 100%   BMI 29.27 kg/m  Gen:   Awake, no distress   Resp:  Normal effort  MSK:   Moves extremities without difficulty  Other:    Medical Decision Making  Medically screening exam initiated at 10:39 PM.  Appropriate orders placed.  Mark Farmer was informed that the remainder of the evaluation will be completed by another provider, this initial triage assessment does not replace that evaluation, and the importance of remaining in the ED until their evaluation is complete.  Zenaida Deed, PA-C 06/28/22 2240

## 2022-06-28 NOTE — ED Triage Notes (Signed)
Present from home for laceration to R pointer finger from kitchen knife. Bleeding controlled pta. Sensation to distal finger intact.  Last tetanus 2018.

## 2022-06-29 MED ORDER — LIDOCAINE HCL (PF) 1 % IJ SOLN
5.0000 mL | Freq: Once | INTRAMUSCULAR | Status: AC
  Administered 2022-06-29: 5 mL
  Filled 2022-06-29: qty 30

## 2022-06-29 NOTE — ED Provider Notes (Signed)
[facility] Provider Note   CSN: [id number] Arrival date & time: 06/28/22  2214     History  Chief Complaint  Patient presents with   Laceration    Mark Farmer is a 47 y.o. male with noncontributory past medical history presents with concern for laceration to right index finger sustained from kitchen knife.  Bleeding controlled prior to arrival with pressure.  He reports last tetanus in 2018, denies any sensory difficulty or difficulty moving the finger other than pain.   Laceration      Home Medications Prior to Admission medications   Medication Sig Start Date End Date Taking? Authorizing Provider  baclofen (LIORESAL) 10 MG tablet Take 1 tablet (10 mg total) by mouth 3 (three) times daily. 04/02/21   Lynden Oxford Scales, PA-C  blood glucose meter kit and supplies KIT Dispense based on patient and insurance preference. Use up to four times daily as directed. (FOR ICD-9 250.00, 250.01). 03/27/18   Vanessa Kick, MD  clonazePAM (KLONOPIN) 1 MG tablet Take 1 mg by mouth every 8 (eight) hours as needed for anxiety.  10/14/17   [provider]  fluticasone (FLONASE) 50 MCG/ACT nasal spray Place 2 sprays into both nostrils daily. 01/15/17   Tasia Catchings, Amy V, PA-C  glucose blood [facility] BLOOD GLUCOSE TEST) test strip Use as instructed 01/04/17   Barnet Glasgow, NP  ibuprofen (ADVIL) 800 MG tablet Take 1 tablet (800 mg total) by mouth 3 (three) times daily. 04/02/21   Lynden Oxford Scales, PA-C  insulin glargine (LANTUS) 100 UNIT/ML injection Inject 0.1 mLs (10 Units total) into the skin daily. 03/27/18   Vanessa Kick, MD  methylPREDNISolone (MEDROL DOSEPAK) 4 MG TBPK tablet Take following package directions 11/17/21   Brunetta Jeans, PA-C      Allergies    Prilosec [omeprazole], Hydrocodone, and Penicillins    Review of Systems   Review of Systems  Skin:  Positive for wound.  All other systems reviewed and are  negative.   Physical Exam Updated Vital Signs BP (!) 148/89 (BP Location: Left Arm)   Pulse (!) 107   Temp 98.3 F (36.8 C) (Oral)   Resp 18   Wt 92.5 kg   SpO2 100%   BMI 29.27 kg/m  Physical Exam Vitals and nursing note reviewed.  Constitutional:      General: He is not in acute distress.    Appearance: Normal appearance.  HENT:     Head: Normocephalic and atraumatic.  Eyes:     General:        Right eye: No discharge.        Left eye: No discharge.  Cardiovascular:     Rate and Rhythm: Normal rate and regular rhythm.  Pulmonary:     Effort: Pulmonary effort is normal. No respiratory distress.  Musculoskeletal:        General: No deformity.     Comments: Intact strength to flexion, extension, normal range of motion of the affected digit, thorough exploration of the wound under local anesthetic reveals no evidence of deep tendon injury, foreign body, or other abnormality  Skin:    General: Skin is warm and dry.     Capillary Refill: Capillary refill takes less than 2 seconds.     Comments: 1 to 2 inch laceration noted along the volar aspect of the pointer finger in the middle phalanx segment.  Neurological:     Mental Status: He is alert and oriented to person, place,  and time.  Psychiatric:        Mood and Affect: Mood normal.        Behavior: Behavior normal.     ED Results / Procedures / Treatments   Labs (all labs ordered are listed, but only abnormal results are displayed) Labs Reviewed - No data to display  EKG None  Radiology DG Finger Index Right  Result Date: 06/28/2022 CLINICAL DATA:  Right finger laceration. EXAM: RIGHT INDEX FINGER 2+V COMPARISON:  None Available. FINDINGS: There is no evidence of an acute fracture or dislocation. A chronic fracture deformity is seen involving the second right metacarpal. There is no evidence of arthropathy or other focal bone abnormality. Soft tissues are unremarkable. IMPRESSION: 1. No acute fracture or  dislocation. 2. 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 Electronically Signed   By: Virgina Norfolk M.D.   On: 06/28/2022 23:31    Procedures .Nerve Block  Date/Time: 06/29/2022 2:05 AM  Performed by: Anselmo Pickler, PA-C Authorized by: Anselmo Pickler, PA-C   Consent:    Consent obtained:  Verbal   Consent given by:  Patient   Risks, benefits, and alternatives were discussed: yes     Risks discussed:  Infection, intravenous injection, allergic reaction, bleeding, nerve damage, swelling, unsuccessful block and pain   Alternatives discussed:  No treatment Universal protocol:    Procedure explained and questions answered to patient or proxy's satisfaction: yes     Patient identity confirmed:  Verbally with patient Indications:    Indications:  Procedural anesthesia Location:    Body area:  Upper extremity   Upper extremity nerve blocked: Digital nerve block of right index finger.   Laterality:  Right Pre-procedure details:    Skin preparation:  Alcohol Skin anesthesia:    Skin anesthesia method:  Local infiltration   Local anesthetic:  Lidocaine 1% w/o epi Procedure details:    Block needle gauge:  25 G   Anesthetic injected:  Lidocaine 1% w/o epi   Injection procedure:  Anatomic landmarks identified, introduced needle, anatomic landmarks palpated, incremental injection and negative aspiration for blood   Paresthesia:  None Post-procedure details:    Outcome:  Anesthesia achieved   Procedure completion:  Tolerated .Marland KitchenLaceration Repair  Date/Time: 06/29/2022 2:06 AM  Performed by: Anselmo Pickler, PA-C Authorized by: Anselmo Pickler, PA-C   Consent:    Consent obtained:  Verbal   Consent given by:  Patient   Risks, benefits, and alternatives were discussed: yes     Risks discussed:  Need for additional repair, pain, infection, poor cosmetic result, poor wound healing, nerve damage, tendon damage and vascular damage   Alternatives  discussed:  No treatment Universal protocol:    Procedure explained and questions answered to patient or proxy's satisfaction: yes     Patient identity confirmed:  Verbally with patient Anesthesia:    Anesthesia method:  Nerve block   Block location:  See separate procedure note   Block needle gauge:  25 G   Block anesthetic:  Lidocaine 1% w/o epi   Block technique:  Ring block   Block injection procedure:  Anatomic landmarks identified, anatomic landmarks palpated, negative aspiration for blood, introduced needle and incremental injection   Block outcome:  Anesthesia achieved Laceration details:    Location:  Finger   Finger location:  R index finger   Length (cm):  3.5   Depth (mm):  6 Treatment:    Area cleansed with:  Shur-Clens   Amount of cleaning:  Standard Skin repair:    Repair method:  Sutures   Suture size:  4-0   Suture material:  Chromic gut   Suture technique:  Simple interrupted   Number of sutures:  6 Approximation:    Approximation:  Close Repair type:    Repair type:  Simple Post-procedure details:    Dressing:  Antibiotic ointment and non-adherent dressing   Procedure completion:  Tolerated     Medications Ordered in ED Medications  Tdap (BOOSTRIX) injection 0.5 mL (0.5 mLs Intramuscular Not Given 06/29/22 0046)  lidocaine (PF) (XYLOCAINE) 1 % injection 5 mL (5 mLs Infiltration Given by Other 06/29/22 0201)    ED Course/ Medical Decision Making/ A&P                             Medical Decision Making Risk Prescription drug management.   This is an overall well-appearing 47 year old male who presents with concern for laceration of the right index finger sustained earlier tonight.  I dependently interpreted plain film imaging of the affected extremity which shows no evidence of new acute fracture, dislocation, or foreign body, he has a chronic fracture deformity of the second digit on the ipsilateral hand.  Patient with laceration to the volar surface  of the finger in the middle phalanx segment.  My emergent differential diagnosis includes simple laceration, tendon damage, damage to the nerve, nailbed, versus other.  On exam patient is neurovascularly intact with no evidence of damage to tendon structure.  Laceration repaired as described above after nerve block of the digit.  Patient tolerated without difficulty.  Bleeding controlled with sutures, bandage placed, and patient instructed on proper wound care precautions.  He is discharged in stable condition at this time.  He declined Tdap which think is reasonable given it has been less than 10 years per his memory of last update. Final Clinical Impression(s) / ED Diagnoses Final diagnoses:  Laceration of right index finger without foreign body without damage to nail, initial encounter    Rx / DC Orders ED Discharge Orders     None         [place] 06/29/22 0210    Dione Booze, MD 06/29/22 [phone]

## 2022-06-29 NOTE — Discharge Instructions (Addendum)
Please keep the wound clean and dry, apply thin layer of Polysporin, Neosporin and change bandage at least once daily.  Please monitor for any signs of infection including worsening redness, pain, swelling, pus draining from the affected area

## 2022-10-08 ENCOUNTER — Other Ambulatory Visit: Payer: Self-pay

## 2022-10-08 ENCOUNTER — Emergency Department (HOSPITAL_COMMUNITY): Payer: Medicaid Other

## 2022-10-08 ENCOUNTER — Encounter (HOSPITAL_COMMUNITY): Payer: Self-pay

## 2022-10-08 ENCOUNTER — Emergency Department (HOSPITAL_COMMUNITY)
Admission: EM | Admit: 2022-10-08 | Discharge: 2022-10-08 | Disposition: A | Discharge status: Home / Self Care | Payer: Medicaid Other | Attending: Emergency Medicine | Admitting: Emergency Medicine

## 2022-10-08 DIAGNOSIS — R079 Chest pain, unspecified: Secondary | ICD-10-CM

## 2022-10-08 DIAGNOSIS — R531 Weakness: Secondary | ICD-10-CM | POA: Insufficient documentation

## 2022-10-08 DIAGNOSIS — R739 Hyperglycemia, unspecified: Secondary | ICD-10-CM

## 2022-10-08 DIAGNOSIS — R202 Paresthesia of skin: Secondary | ICD-10-CM | POA: Insufficient documentation

## 2022-10-08 DIAGNOSIS — F17200 Nicotine dependence, unspecified, uncomplicated: Secondary | ICD-10-CM | POA: Insufficient documentation

## 2022-10-08 DIAGNOSIS — Z794 Long term (current) use of insulin: Secondary | ICD-10-CM | POA: Insufficient documentation

## 2022-10-08 DIAGNOSIS — E1165 Type 2 diabetes mellitus with hyperglycemia: Secondary | ICD-10-CM | POA: Insufficient documentation

## 2022-10-08 DIAGNOSIS — R0789 Other chest pain: Secondary | ICD-10-CM | POA: Diagnosis not present

## 2022-10-08 LAB — COMPREHENSIVE METABOLIC PANEL
ALT: 17 U/L (ref 0–44)
AST: 18 U/L (ref 15–41)
Albumin: 3.7 g/dL (ref 3.5–5.0)
Alkaline Phosphatase: 44 U/L (ref 38–126)
Anion gap: 8 (ref 5–15)
BUN: 10 mg/dL (ref 6–20)
CO2: 25 mmol/L (ref 22–32)
Calcium: 8.4 mg/dL — ABNORMAL LOW (ref 8.9–10.3)
Chloride: 106 mmol/L (ref 98–111)
Creatinine, Ser: 1.09 mg/dL (ref 0.61–1.24)
GFR, Estimated: >60 mL/min (ref >60)
Glucose, Bld: 192 mg/dL — ABNORMAL HIGH (ref 70–99)
Potassium: 3.5 mmol/L (ref 3.5–5.1)
Sodium: 139 mmol/L (ref 135–145)
Total Bilirubin: 0.4 mg/dL (ref 0.3–1.2)
Total Protein: 6.2 g/dL — ABNORMAL LOW (ref 6.5–8.1)

## 2022-10-08 LAB — CBC
HCT: 39.9 % (ref 39.0–52.0)
Hemoglobin: 13.3 g/dL (ref 13.0–17.0)
MCH: 31 pg (ref 26.0–34.0)
MCHC: 33.3 g/dL (ref 30.0–36.0)
MCV: 93 fL (ref 80.0–100.0)
Platelets: 243 10*3/uL (ref 150–400)
RBC: 4.29 MIL/uL (ref 4.22–5.81)
RDW: 12.3 % (ref 11.5–15.5)
WBC: 5.3 10*3/uL (ref 4.0–10.5)
nRBC: 0 % (ref 0.0–0.2)

## 2022-10-08 LAB — TROPONIN I (HIGH SENSITIVITY)
Troponin I (High Sensitivity): <2 ng/L (ref <18)
Troponin I (High Sensitivity): 2 ng/L (ref <18)

## 2022-10-08 MED ORDER — IOHEXOL 350 MG/ML SOLN
100.0000 mL | Freq: Once | INTRAVENOUS | Status: AC | PRN
  Administered 2022-10-08: 100 mL via INTRAVENOUS

## 2022-10-08 NOTE — Discharge Instructions (Signed)
Take Pepcid to reduce your pain.  Follow-up with your primary care physician. Your caregiver has diagnosed you as having chest pain that is not specific for one problem, but does not require admission.  You are at low risk for an acute heart condition or other serious illness. Chest pain comes from many different causes.  SEEK IMMEDIATE MEDICAL ATTENTION IF: You have severe chest pain, especially if the pain is crushing or pressure-like and spreads to the arms, back, neck, or jaw, or if you have sweating, nausea (feeling sick to your stomach), or shortness of breath. THIS IS AN EMERGENCY. Don't wait to see if the pain will go away. Get medical help at once. Call 911 or 0 (operator). DO NOT drive yourself to the hospital.  Your chest pain gets worse and does not go away with rest.  You have an attack of chest pain lasting longer than usual, despite rest and treatment with the medications your caregiver has prescribed.  You wake from sleep with chest pain or shortness of breath.  You feel dizzy or faint.  You have chest pain not typical of your usual pain for which you originally saw your caregiver.

## 2022-10-08 NOTE — ED Triage Notes (Signed)
Patient began having left sided chest pain that began 1 hour ago. Radiates down to his left arm and leg. Feels like indigestion.

## 2022-10-08 NOTE — ED Provider Notes (Signed)
[place] EMERGENCY DEPARTMENT AT [facility] Provider Note   CSN: [id number] Arrival date & time: 10/08/22  1557     History {Add pertinent medical, surgical, social history, OB history to HPI:1} Chief Complaint  Patient presents with  . Chest Pain    Mark Farmer is a 47 y.o. male.  Who presents emergency department with chief complaint of chest pain.  Patient reports a past medical history of diabetes but states that "I do not really have that problem anymore."  He does not see doctors regularly.  He is a daily smoker.  He denies a history of hypertension or hyperlipidemia.  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  He then developed pain in his left arm along with numbness and paresthesia of the left face, numbness weakness and paresthesia of the left upper extremity and paresthesia of the lower extremity on the left.  He states that he sat down and his pain seemed to ease up after he tried to drink some soda and took some baking soda and water.  He still has the pain.  It is not made worse with movement of the arm or chest.  He denies neck pain, visual change, difficulty with speech or swallowing.  He has no never had anything like this before.   Chest Pain      Home Medications Prior to Admission medications   Medication Sig Start Date End Date Taking? Authorizing Provider  baclofen (LIORESAL) 10 MG tablet Take 1 tablet (10 mg total) by mouth 3 (three) times daily. 04/02/21   Theadora Rama Scales, PA-C  blood glucose meter kit and supplies KIT Dispense based on patient and insurance preference. Use up to four times daily as directed. (FOR ICD-9 250.00, 250.01). 03/27/18   Mardella Layman, MD  clonazePAM (KLONOPIN) 1 MG tablet Take 1 mg by mouth every 8 (eight) hours as needed for anxiety.  10/14/17   [provider]  fluticasone (FLONASE) 50 MCG/ACT nasal spray Place 2  sprays into both nostrils daily. 01/15/17   Cathie Hoops, Amy V, PA-C  glucose blood [facility] BLOOD GLUCOSE TEST) test strip Use as instructed 01/04/17   Dorena Bodo, NP  ibuprofen (ADVIL) 800 MG tablet Take 1 tablet (800 mg total) by mouth 3 (three) times daily. 04/02/21   Theadora Rama Scales, PA-C  insulin glargine (LANTUS) 100 UNIT/ML injection Inject 0.1 mLs (10 Units total) into the skin daily. 03/27/18   Mardella Layman, MD  methylPREDNISolone (MEDROL DOSEPAK) 4 MG TBPK tablet Take following package directions 11/17/21   Waldon Merl, PA-C      Allergies    Prilosec [omeprazole], Hydrocodone, and Penicillins    Review of Systems   Review of Systems  Cardiovascular:  Positive for chest pain.    Physical Exam Updated Vital Signs BP (!) 150/90   Pulse (!) 58   Temp 98 F (36.7 C) (Oral)   Resp 16   Ht 5\' 10"  (1.778 m)   Wt 93 kg   SpO2 100%   BMI 29.41 kg/m  Physical Exam Vitals and nursing note reviewed.  Constitutional:      General: He is not in acute distress.    Appearance: He is well-developed. He is not diaphoretic.  HENT:     Head: Normocephalic and atraumatic.  Eyes:     General: No scleral icterus.    Conjunctiva/sclera: Conjunctivae normal.  Cardiovascular:  Rate and Rhythm: Normal rate and regular rhythm.     Heart sounds: Normal heart sounds.  Pulmonary:     Effort: Pulmonary effort is normal. No respiratory distress.     Breath sounds: No decreased breath sounds or wheezing.  Chest:     Chest wall: No tenderness.  Abdominal:     Palpations: Abdomen is soft.     Tenderness: There is no abdominal tenderness.  Musculoskeletal:     Cervical back: Normal range of motion and neck supple.  Skin:    General: Skin is warm and dry.  Neurological:     Mental Status: He is alert and oriented to person, place, and time.     GCS: GCS eye subscore is 4. GCS verbal subscore is 5. GCS motor subscore is 6.     Cranial Nerves: Cranial nerves 2-12 are intact. No  dysarthria.     Sensory: Sensory deficit (decreased sensation upper /lower extremity) present.     Motor: Weakness (LUE) present.     Coordination: Coordination is intact. Coordination normal.     Deep Tendon Reflexes: Reflexes are normal and symmetric.  Psychiatric:        Behavior: Behavior normal.    ED Results / Procedures / Treatments   Labs (all labs ordered are listed, but only abnormal results are displayed) Labs Reviewed  COMPREHENSIVE METABOLIC PANEL - Abnormal; Notable for the following components:      Result Value   Glucose, Bld 192 (*)    Calcium 8.4 (*)    Total Protein 6.2 (*)    All other components within normal limits  CBC  TROPONIN I (HIGH SENSITIVITY)  TROPONIN I (HIGH SENSITIVITY)    EKG EKG Interpretation  Date/Time:  Sunday Oct 08 2022 16:09:02 EDT Ventricular Rate:  80 PR Interval:  143 QRS Duration: 98 QT Interval:  387 QTC Calculation: 447 R Axis:   7 Text Interpretation: Sinus rhythm Confirmed by Ernie Avena ([id number]) on 10/08/2022 4:31:34 PM  Radiology DG Chest Port 1 View  Result Date: 10/08/2022 CLINICAL DATA:  cp EXAM: PORTABLE CHEST 1 VIEW COMPARISON:  November 04, 2017 FINDINGS: The cardiomediastinal silhouette is unchanged in contour. No pleural effusion. No pneumothorax. No acute pleuroparenchymal abnormality. IMPRESSION: No acute cardiopulmonary abnormality. Electronically Signed   By: Meda Klinefelter M.D.   On: 10/08/2022 16:49    Procedures Procedures  {Document cardiac monitor, telemetry assessment procedure when appropriate:1}  Medications Ordered in ED Medications  iohexol (OMNIPAQUE) 350 MG/ML injection 100 mL (100 mLs Intravenous Contrast Given 10/08/22 1923)    ED Course/ Medical Decision Making/ A&P Clinical Course as of 10/08/22 2113  Sun Oct 08, 2022  2028 Glucose(!): 192 [AH]    Clinical Course User Index [AH] Arthor Captain, PA-C   {   Click here for ABCD2, HEART and other calculatorsREFRESH Note before signing :1}                           Medical Decision Making Amount and/or Complexity of Data Reviewed Labs: ordered. Radiology: ordered.  Risk Prescription drug management.   ***  {Document critical care time when appropriate:1} {Document review of labs and clinical decision tools ie heart score, Chads2Vasc2 etc:1}  {Document your independent review of radiology images, and any outside records:1} {Document your discussion with family members, caretakers, and with consultants:1} {Document social determinants of health affecting pt's care:1} {Document your decision making why or why not admission, treatments were needed:1} Final Clinical  Impression(s) / ED Diagnoses Final diagnoses:  None    Rx / DC Orders ED Discharge Orders     None

## 2022-10-16 ENCOUNTER — Emergency Department (HOSPITAL_COMMUNITY): Payer: Medicaid Other

## 2022-10-16 ENCOUNTER — Encounter (HOSPITAL_COMMUNITY): Payer: Self-pay

## 2022-10-16 ENCOUNTER — Other Ambulatory Visit: Payer: Self-pay

## 2022-10-16 ENCOUNTER — Emergency Department (HOSPITAL_COMMUNITY)
Admission: EM | Admit: 2022-10-16 | Discharge: 2022-10-17 | Disposition: A | Discharge status: Home / Self Care | Payer: Medicaid Other | Attending: Emergency Medicine | Admitting: Emergency Medicine

## 2022-10-16 DIAGNOSIS — M940 Chondrocostal junction syndrome [Tietze]: Secondary | ICD-10-CM | POA: Insufficient documentation

## 2022-10-16 DIAGNOSIS — R079 Chest pain, unspecified: Secondary | ICD-10-CM | POA: Diagnosis present

## 2022-10-16 DIAGNOSIS — M542 Cervicalgia: Secondary | ICD-10-CM

## 2022-10-16 LAB — BASIC METABOLIC PANEL
Anion gap: 10 (ref 5–15)
BUN: 9 mg/dL (ref 6–20)
CO2: 24 mmol/L (ref 22–32)
Calcium: 9.1 mg/dL (ref 8.9–10.3)
Chloride: 103 mmol/L (ref 98–111)
Creatinine, Ser: 0.89 mg/dL (ref 0.61–1.24)
GFR, Estimated: >60 mL/min (ref >60)
Glucose, Bld: 209 mg/dL — ABNORMAL HIGH (ref 70–99)
Potassium: 3.3 mmol/L — ABNORMAL LOW (ref 3.5–5.1)
Sodium: 137 mmol/L (ref 135–145)

## 2022-10-16 LAB — CBC
HCT: 43.8 % (ref 39.0–52.0)
Hemoglobin: 14.6 g/dL (ref 13.0–17.0)
MCH: 31.1 pg (ref 26.0–34.0)
MCHC: 33.3 g/dL (ref 30.0–36.0)
MCV: 93.4 fL (ref 80.0–100.0)
Platelets: 280 10*3/uL (ref 150–400)
RBC: 4.69 MIL/uL (ref 4.22–5.81)
RDW: 12.8 % (ref 11.5–15.5)
WBC: 6.1 10*3/uL (ref 4.0–10.5)
nRBC: 0 % (ref 0.0–0.2)

## 2022-10-16 LAB — TROPONIN I (HIGH SENSITIVITY)
Troponin I (High Sensitivity): <2 ng/L (ref <18)
Troponin I (High Sensitivity): <2 ng/L (ref <18)

## 2022-10-16 MED ORDER — KETOROLAC TROMETHAMINE 30 MG/ML IJ SOLN
30.0000 mg | Freq: Once | INTRAMUSCULAR | Status: AC
  Administered 2022-10-16: 30 mg via INTRAVENOUS
  Filled 2022-10-16: qty 1

## 2022-10-16 MED ORDER — CYCLOBENZAPRINE HCL 10 MG PO TABS
5.0000 mg | ORAL_TABLET | Freq: Once | ORAL | Status: AC
  Administered 2022-10-16: 5 mg via ORAL
  Filled 2022-10-16: qty 1

## 2022-10-16 MED ORDER — CYCLOBENZAPRINE HCL 10 MG PO TABS: 10.0000 mg | ORAL_TABLET | Freq: Two times a day (BID) | ORAL | 0 refills | Status: AC | PRN

## 2022-10-16 MED ORDER — NAPROXEN 375 MG PO TABS: 375.0000 mg | ORAL_TABLET | Freq: Two times a day (BID) | ORAL | 0 refills | Status: AC

## 2022-10-16 NOTE — ED Provider Notes (Signed)
[place] EMERGENCY DEPARTMENT AT [facility] Provider Note   CSN: [id number] Arrival date & time: 10/16/22  2011     History  Chief Complaint  Patient presents with   Chest Pain   Neck Pain    Mark Farmer is a 47 y.o. male.   Chest Pain Neck Pain Associated symptoms: chest pain      Pt has been having pain in his chest for the last couple of weeks.  The pain is sharp and he has a sore spot on his chest in the midline.  It hurts to touch.  The episodes last minutes at a time.  Pt states today he started having cramping in his neck as well. No fever or cough. Not dyspnea.  No history of heart or lung disease.  No hx of dvt or pe.    Home Medications Prior to Admission medications   Medication Sig Start Date End Date Taking? Authorizing Provider  cyclobenzaprine (FLEXERIL) 10 MG tablet Take 1 tablet (10 mg total) by mouth 2 (two) times daily as needed for muscle spasms. 10/16/22  Yes Linwood Dibbles, MD  naproxen (NAPROSYN) 375 MG tablet Take 1 tablet (375 mg total) by mouth 2 (two) times daily. 10/16/22  Yes Linwood Dibbles, MD  baclofen (LIORESAL) 10 MG tablet Take 1 tablet (10 mg total) by mouth 3 (three) times daily. 04/02/21   Theadora Rama Scales, PA-C  blood glucose meter kit and supplies KIT Dispense based on patient and insurance preference. Use up to four times daily as directed. (FOR ICD-9 250.00, 250.01). 03/27/18   Mardella Layman, MD  clonazePAM (KLONOPIN) 1 MG tablet Take 1 mg by mouth every 8 (eight) hours as needed for anxiety.  10/14/17   [provider]  fluticasone (FLONASE) 50 MCG/ACT nasal spray Place 2 sprays into both nostrils daily. 01/15/17   Cathie Hoops, Amy V, PA-C  glucose blood [facility] BLOOD GLUCOSE TEST) test strip Use as instructed 01/04/17   Dorena Bodo, NP  insulin glargine (LANTUS) 100 UNIT/ML injection Inject 0.1 mLs (10 Units total) into the skin daily. 03/27/18   Mardella Layman, MD  methylPREDNISolone (MEDROL DOSEPAK) 4 MG TBPK tablet Take  following package directions 11/17/21   Waldon Merl, PA-C      Allergies    Prilosec [omeprazole], Hydrocodone, and Penicillins    Review of Systems   Review of Systems  Cardiovascular:  Positive for chest pain.  Musculoskeletal:  Positive for neck pain.    Physical Exam Updated Vital Signs BP (!) 149/104   Pulse 65   Temp 98.5 F (36.9 C) (Oral)   Resp 20   Ht 1.778 m (5\' 10" )   Wt 93 kg   SpO2 100%   BMI 29.41 kg/m  Physical Exam Vitals and nursing note reviewed.  Constitutional:      General: He is not in acute distress.    Appearance: He is well-developed.  HENT:     Head: Normocephalic and atraumatic.     Right Ear: External ear normal.     Left Ear: External ear normal.  Eyes:     General: No scleral icterus.       Right eye: No discharge.        Left eye: No discharge.     Conjunctiva/sclera: Conjunctivae normal.  Neck:     Trachea: No tracheal deviation.  Cardiovascular:     Rate and Rhythm: Normal rate and regular rhythm.  Pulmonary:     Effort: Pulmonary effort is normal.  No respiratory distress.     Breath sounds: Normal breath sounds. No stridor. No wheezing or rales.  Chest:     Chest wall: Tenderness present. No crepitus or edema.  Abdominal:     General: Bowel sounds are normal. There is no distension.     Palpations: Abdomen is soft.     Tenderness: There is no abdominal tenderness. There is no guarding or rebound.  Musculoskeletal:        General: No tenderness or deformity.     Cervical back: Neck supple. No rigidity or crepitus. Muscular tenderness present.  Skin:    General: Skin is warm and dry.     Findings: No rash.  Neurological:     General: No focal deficit present.     Mental Status: He is alert.     Cranial Nerves: No cranial nerve deficit, dysarthria or facial asymmetry.     Sensory: No sensory deficit.     Motor: No abnormal muscle tone or seizure activity.     Coordination: Coordination normal.  Psychiatric:         Mood and Affect: Mood normal.     ED Results / Procedures / Treatments   Labs (all labs ordered are listed, but only abnormal results are displayed) Labs Reviewed  BASIC METABOLIC PANEL - Abnormal; Notable for the following components:      Result Value   Potassium 3.3 (*)    Glucose, Bld 209 (*)    All other components within normal limits  CBC  TROPONIN I (HIGH SENSITIVITY)  TROPONIN I (HIGH SENSITIVITY)    EKG EKG Interpretation  Date/Time:  Monday Oct 16 2022 20:20:02 EDT Ventricular Rate:  74 PR Interval:  143 QRS Duration: 92 QT Interval:  373 QTC Calculation: 414 R Axis:   0 Text Interpretation: Sinus rhythm No significant change since last tracing Confirmed by Linwood Dibbles [phone]) on 10/16/2022 9:50:00 PM  Radiology DG Chest 2 View  Result Date: 10/16/2022 CLINICAL DATA:  Chest pain. EXAM: CHEST - 2 VIEW COMPARISON:  10/08/2022. FINDINGS: Clear lungs. Stable cardiac and mediastinal contours. No pleural effusion or pneumothorax. Visualized bones and upper abdomen are unremarkable. IMPRESSION: No evidence of acute cardiopulmonary disease. Electronically Signed   By: Orvan Falconer M.D.   On: 10/16/2022 20:46    Procedures Procedures    Medications Ordered in ED Medications  ketorolac (TORADOL) 30 MG/ML injection 30 mg (30 mg Intravenous Given 10/16/22 2209)  cyclobenzaprine (FLEXERIL) tablet 5 mg (5 mg Oral Given 10/16/22 2209)    ED Course/ Medical Decision Making/ A&P Clinical Course as of 10/16/22 2358  Mon Oct 16, 2022  2357 Labs reviewed.  CBC metabolic panel normal.  Serial troponin normal.  Chest x-ray without acute abnormalities [JK]    Clinical Course User Index [JK] Linwood Dibbles, MD             HEART Score: 2                Medical Decision Making Frontal diagnosis includes but not limited to, acute coronary syndrome, pulmonary embolism, pneumonia, pneumothorax, costochondritis  Problems Addressed: Costochondritis, acute: acute illness or injury  that poses a threat to life or bodily functions Neck pain: acute illness or injury  Amount and/or Complexity of Data Reviewed Labs: ordered. Decision-making details documented in ED Course. Radiology: ordered and independent interpretation performed.  Risk Prescription drug management.  Patient presented to ED for evaluation of chest pain.  ED workup reassuring.  Patient's symptoms atypical for  acute coronary syndrome.  He is not short of breath.  PERC negative.  No evidence of pneumonia or pneumothorax.  Patient does have chest wall tenderness and is also having muscular tenderness in his neck.  Suspicious for costochondritis and muscle spasm.  Will discharge home with NSAIDs muscle relaxant.  Patient requested a work note so he can take a few days off.  This was provided         Final Clinical Impression(s) / ED Diagnoses Final diagnoses:  Neck pain  Costochondritis, acute    Rx / DC Orders ED Discharge Orders          Ordered    naproxen (NAPROSYN) 375 MG tablet  2 times daily        10/16/22 2352    cyclobenzaprine (FLEXERIL) 10 MG tablet  2 times daily PRN        10/16/22 2352              Linwood Dibbles, MD 10/16/22 2358

## 2022-10-16 NOTE — ED Triage Notes (Signed)
Pt reports CP/neck pain that started ago. Pt report this happening last week at well.

## 2022-10-16 NOTE — Discharge Instructions (Addendum)
Take the medications as needed for pain.  Follow-up with your primary care doctor to be rechecked.  Return for worsening symptoms
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
## 2023-08-09 ENCOUNTER — Emergency Department (HOSPITAL_BASED_OUTPATIENT_CLINIC_OR_DEPARTMENT_OTHER)
Admission: EM | Admit: 2023-08-09 | Discharge: 2023-08-09 | Disposition: A | Discharge status: Home / Self Care | Payer: MEDICAID | Attending: Emergency Medicine | Admitting: Emergency Medicine

## 2023-08-09 ENCOUNTER — Other Ambulatory Visit: Payer: Self-pay

## 2023-08-09 ENCOUNTER — Emergency Department (HOSPITAL_BASED_OUTPATIENT_CLINIC_OR_DEPARTMENT_OTHER): Payer: MEDICAID | Admitting: Radiology

## 2023-08-09 DIAGNOSIS — E119 Type 2 diabetes mellitus without complications: Secondary | ICD-10-CM | POA: Diagnosis not present

## 2023-08-09 DIAGNOSIS — M25512 Pain in left shoulder: Secondary | ICD-10-CM | POA: Insufficient documentation

## 2023-08-09 DIAGNOSIS — M25552 Pain in left hip: Secondary | ICD-10-CM | POA: Insufficient documentation

## 2023-08-09 MED ORDER — PREDNISONE 20 MG PO TABS: 40.0000 mg | ORAL_TABLET | Freq: Every day | ORAL | 0 refills | Status: DC

## 2023-08-09 MED ORDER — PREDNISONE 20 MG PO TABS: 40.0000 mg | ORAL_TABLET | Freq: Every day | ORAL | 0 refills | Status: AC

## 2023-08-09 MED ORDER — METHOCARBAMOL 500 MG PO TABS: 500.0000 mg | ORAL_TABLET | Freq: Two times a day (BID) | ORAL | 0 refills | Status: DC

## 2023-08-09 MED ORDER — KETOROLAC TROMETHAMINE 15 MG/ML IJ SOLN: 15.0000 mg | Freq: Once | INTRAMUSCULAR | Status: DC

## 2023-08-09 MED ORDER — KETOROLAC TROMETHAMINE 15 MG/ML IJ SOLN
15.0000 mg | Freq: Once | INTRAMUSCULAR | Status: AC
  Administered 2023-08-09: 15 mg via INTRAMUSCULAR
  Filled 2023-08-09: qty 1

## 2023-08-09 MED ORDER — METHOCARBAMOL 500 MG PO TABS
500.0000 mg | ORAL_TABLET | Freq: Once | ORAL | Status: DC
  Filled 2023-08-09: qty 1

## 2023-08-09 MED ORDER — METHOCARBAMOL 500 MG PO TABS: 500.0000 mg | ORAL_TABLET | Freq: Two times a day (BID) | ORAL | 0 refills | Status: AC

## 2023-08-09 MED ORDER — IBUPROFEN 600 MG PO TABS: 600.0000 mg | ORAL_TABLET | Freq: Four times a day (QID) | ORAL | 2 refills | Status: DC | PRN

## 2023-08-09 MED ORDER — IBUPROFEN 600 MG PO TABS: 600.0000 mg | ORAL_TABLET | Freq: Four times a day (QID) | ORAL | 2 refills | Status: AC | PRN

## 2023-08-09 NOTE — ED Provider Notes (Signed)
 [place] [facility] AT [facility] Provider Note   CSN: [id number] Arrival date & time: 08/09/23  1508     History  Chief Complaint  Patient presents with   Hip Pain   Shoulder Pain    Mark Farmer is a 48 y.o. male with past medical history significant for diabetes, chronic back pain, history of tobacco use although he currently endorses no tobacco use who presents concern for sudden onset left hip and acute on chronic left shoulder pain.  He denies any recent injury, trauma reports that he had been doing some shoulder workouts and dips and noticed that the pain started after that.  He reports no relief with Advil.  He reports he sees pain management, no current narcotic usage however.   Hip Pain  Shoulder Pain      Home Medications Prior to Admission medications   Medication Sig Start Date End Date Taking? Authorizing Provider  ibuprofen (ADVIL) 600 MG tablet Take 1 tablet (600 mg total) by mouth every 6 (six) hours as needed. 08/09/23  Yes Graysin Luczynski H, PA-C  methocarbamol (ROBAXIN) 500 MG tablet Take 1 tablet (500 mg total) by mouth 2 (two) times daily. 08/09/23  Yes Shazia Mitchener H, PA-C  predniSONE (DELTASONE) 20 MG tablet Take 2 tablets (40 mg total) by mouth daily. 08/09/23  Yes Teagen Bucio H, PA-C  baclofen (LIORESAL) 10 MG tablet Take 1 tablet (10 mg total) by mouth 3 (three) times daily. 04/02/21   Theadora Rama Scales, PA-C  blood glucose meter kit and supplies KIT Dispense based on patient and insurance preference. Use up to four times daily as directed. (FOR ICD-9 250.00, 250.01). 03/27/18   Mardella Layman, MD  clonazePAM (KLONOPIN) 1 MG tablet Take 1 mg by mouth every 8 (eight) hours as needed for anxiety.  10/14/17   [provider]  cyclobenzaprine (FLEXERIL) 10 MG tablet Take 1 tablet (10 mg total) by mouth 2 (two) times daily as needed for muscle spasms. 10/16/22   Linwood Dibbles, MD  fluticasone (FLONASE) 50 MCG/ACT  nasal spray Place 2 sprays into both nostrils daily. 01/15/17   Cathie Hoops, Amy V, PA-C  glucose blood [facility] BLOOD GLUCOSE TEST) test strip Use as instructed 01/04/17   Dorena Bodo, NP  insulin glargine (LANTUS) 100 UNIT/ML injection Inject 0.1 mLs (10 Units total) into the skin daily. 03/27/18   Mardella Layman, MD  methylPREDNISolone (MEDROL DOSEPAK) 4 MG TBPK tablet Take following package directions 11/17/21   Waldon Merl, PA-C  naproxen (NAPROSYN) 375 MG tablet Take 1 tablet (375 mg total) by mouth 2 (two) times daily. 10/16/22   Linwood Dibbles, MD      Allergies    Prilosec [omeprazole], Hydrocodone, and Penicillins    Review of Systems   Review of Systems  All other systems reviewed and are negative.   Physical Exam Updated Vital Signs BP (!) 141/81 (BP Location: Right Arm)   Pulse 81   Temp 98 F (36.7 C)   Resp 16   Ht 5\' 9"  (1.753 m)   Wt 92.1 kg   SpO2 97%   BMI 29.98 kg/m  Physical Exam Vitals and nursing note reviewed.  Constitutional:      General: He is not in acute distress.    Appearance: Normal appearance.  HENT:     Head: Normocephalic and atraumatic.  Eyes:     General:        Right eye: No discharge.        Left  eye: No discharge.  Cardiovascular:     Rate and Rhythm: Normal rate and regular rhythm.     Pulses: Normal pulses.     Heart sounds: No murmur heard.    No friction rub. No gallop.  Pulmonary:     Effort: Pulmonary effort is normal.     Breath sounds: Normal breath sounds.  Abdominal:     General: Bowel sounds are normal.     Palpations: Abdomen is soft.  Musculoskeletal:     Comments: Tenderness to palpation of the left shoulder, some pain with attempted crossarm adduction but able to passively range the shoulder with some pain.  Decree strength to flexion, extension secondary to pain.  No obvious step-off or deformity.  Significant focal tenderness to palpation over the left hip, with intact strength to flexion, extension of the left thigh,  normal gait.  Skin:    General: Skin is warm and dry.     Capillary Refill: Capillary refill takes less than 2 seconds.  Neurological:     Mental Status: He is alert and oriented to person, place, and time.  Psychiatric:        Mood and Affect: Mood normal.        Behavior: Behavior normal.     ED Results / Procedures / Treatments   Labs (all labs ordered are listed, but only abnormal results are displayed) Labs Reviewed - No data to display  EKG None  Radiology No results found.  Procedures Procedures    Medications Ordered in ED Medications  methocarbamol (ROBAXIN) tablet 500 mg (500 mg Oral Not Given 08/09/23 1721)  ketorolac (TORADOL) 15 MG/ML injection 15 mg (15 mg Intramuscular Given 08/09/23 1718)    ED Course/ Medical Decision Making/ A&P                                 Medical Decision Making Amount and/or Complexity of Data Reviewed Radiology: ordered.  Risk Prescription drug management.   This patient is a 48 y.o. male who presents to the ED for concern of left hip pain, left shoulder pain.   Differential diagnoses prior to evaluation: Fracture, dislocation, muscle strain, arthritis, versus other  Past Medical History / Social History / Additional history: Chart reviewed. Pertinent results include: Diabetes, chronic back pain  Physical Exam: Physical exam performed. The pertinent findings include: Some focal tenderness of the left humeral head, left hip, overall normal range of motion throughout, no step-off, deformity.  Both extremities are neurovascularly intact with normal pulses distally, normal capillary refill distally.  I independently interpreted plain film radiograph of the left hip, left shoulder which shows no evidence of acute fracture, dislocation.  I agree with radiologist interpretation.  Medications / Treatment: Toradol, Robaxin in the ED, discharged with Robaxin, discussed steroid burst, close orthopedic follow-up.    Disposition: After consideration of the diagnostic results and the patients response to treatment, I feel that patient is stable for discharge with plan as above.   emergency department workup does not suggest an emergent condition requiring admission or immediate intervention beyond what has been performed at this time. The plan is: as above. The patient is safe for discharge and has been instructed to return immediately for worsening symptoms, change in symptoms or any other concerns.  Final Clinical Impression(s) / ED Diagnoses Final diagnoses:  Acute pain of left shoulder  Left hip pain    Rx / DC Orders ED Discharge  Orders          Ordered    ibuprofen (ADVIL) 600 MG tablet  Every 6 hours PRN        08/09/23 1751    methocarbamol (ROBAXIN) 500 MG tablet  2 times daily        08/09/23 1751    predniSONE (DELTASONE) 20 MG tablet  Daily        08/09/23 1751              Deseray Daponte, [place] H, PA-C 08/09/23 1751    Rexford Maus, DO 08/09/23 [phone]

## 2023-08-09 NOTE — Discharge Instructions (Addendum)
 Please use Tylenol or ibuprofen for pain.  You may use 600 mg ibuprofen every 6 hours or 1000 mg of Tylenol every 6 hours.  You may choose to alternate between the 2.  This would be most effective.  Not to exceed 4 g of Tylenol within 24 hours.  Not to exceed 3200 mg ibuprofen 24 hours.  You can use the muscle relaxant I am prescribing in addition to the above to help with any breakthrough pain.  You can take it up to twice daily.  It is safe to take at night, but I would be cautious taking it during the day as it can cause some drowsiness.  Make sure that you are feeling awake and alert before you get behind the wheel of a car or operate a motor vehicle.  It is not a narcotic pain medication so you are able to take it if it is not making you drowsy and still pilot a vehicle or machinery safely.  Take the entire course of steroids that I prescribed, please follow-up with the orthopedic physician's contact formation I provided above if your symptoms are ongoing despite treatment.

## 2023-08-09 NOTE — ED Notes (Signed)
 Pt appears to have left before discharge papers can be reviewed.

## 2023-08-09 NOTE — ED Triage Notes (Signed)
 Pt POV reporting L hip and shoulder pain, denies injury/trauma. States he is unable to sleep due to pain.
